# Patient Record
Sex: Female | Born: 1995 | Race: White | Hispanic: No | Marital: Single | Smoking: Former smoker
Health system: Southern US, Community
[De-identification: ages and names within clinical notes are randomized; demographics above are authoritative.]

## PROBLEM LIST (undated history)

## (undated) DIAGNOSIS — N907 Vulvar cyst: Secondary | ICD-10-CM

## (undated) DIAGNOSIS — T7840XA Allergy, unspecified, initial encounter: Secondary | ICD-10-CM

## (undated) DIAGNOSIS — J45909 Unspecified asthma, uncomplicated: Secondary | ICD-10-CM

## (undated) DIAGNOSIS — F419 Anxiety disorder, unspecified: Secondary | ICD-10-CM

## (undated) DIAGNOSIS — F410 Panic disorder [episodic paroxysmal anxiety] without agoraphobia: Secondary | ICD-10-CM

## (undated) DIAGNOSIS — L0591 Pilonidal cyst without abscess: Secondary | ICD-10-CM

## (undated) HISTORY — DX: Vulvar cyst: N90.7

## (undated) HISTORY — DX: Anxiety disorder, unspecified: F41.9

## (undated) HISTORY — DX: Unspecified asthma, uncomplicated: J45.909

## (undated) HISTORY — DX: Pilonidal cyst without abscess: L05.91

## (undated) HISTORY — DX: Panic disorder (episodic paroxysmal anxiety): F41.0

## (undated) HISTORY — DX: Allergy, unspecified, initial encounter: T78.40XA

---
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
## 2015-11-14 HISTORY — PX: WISDOM TOOTH EXTRACTION: SHX21

## 2016-12-22 DIAGNOSIS — J4520 Mild intermittent asthma, uncomplicated: Secondary | ICD-10-CM | POA: Insufficient documentation

## 2018-03-14 ENCOUNTER — Ambulatory Visit: Payer: Self-pay | Admitting: Physician Assistant

## 2018-03-14 ENCOUNTER — Other Ambulatory Visit: Payer: Self-pay

## 2018-03-14 ENCOUNTER — Encounter: Payer: Self-pay | Admitting: Physician Assistant

## 2018-03-14 ENCOUNTER — Ambulatory Visit (INDEPENDENT_AMBULATORY_CARE_PROVIDER_SITE_OTHER): Payer: BLUE CROSS/BLUE SHIELD | Admitting: Physician Assistant

## 2018-03-14 VITALS — BP 100/68 | HR 68 | Ht 66.0 in | Wt 220.0 lb

## 2018-03-14 DIAGNOSIS — Z0000 Encounter for general adult medical examination without abnormal findings: Secondary | ICD-10-CM

## 2018-03-14 DIAGNOSIS — Z6835 Body mass index (BMI) 35.0-35.9, adult: Secondary | ICD-10-CM

## 2018-03-14 DIAGNOSIS — J301 Allergic rhinitis due to pollen: Secondary | ICD-10-CM

## 2018-03-14 DIAGNOSIS — E66812 Obesity, class 2: Secondary | ICD-10-CM | POA: Insufficient documentation

## 2018-03-14 DIAGNOSIS — Z124 Encounter for screening for malignant neoplasm of cervix: Secondary | ICD-10-CM

## 2018-03-14 DIAGNOSIS — Z7689 Persons encountering health services in other specified circumstances: Secondary | ICD-10-CM | POA: Diagnosis not present

## 2018-03-14 DIAGNOSIS — E782 Mixed hyperlipidemia: Secondary | ICD-10-CM | POA: Insufficient documentation

## 2018-03-14 DIAGNOSIS — E6609 Other obesity due to excess calories: Secondary | ICD-10-CM | POA: Diagnosis not present

## 2018-03-14 MED ORDER — FLUTICASONE PROPIONATE 50 MCG/ACT NA SUSP: 2.0000 | Freq: Every day | NASAL | 6 refills | Status: DC

## 2018-03-14 NOTE — Progress Notes (Deleted)
       Patient: Margaret Grant, Female    DOB: [birth year], 22 y.o.   MRN: [id number] Visit Date: 03/14/2018  Today's Provider: Mar Daring, PA-C   No chief complaint on file.  Subjective:    Annual physical exam Margaret Grant is a 22 y.o. female who presents today for health maintenance and complete physical. She feels {DESC; WELL/FAIRLY WELL/POORLY:18703}. She reports exercising ***. She reports she is sleeping {DESC; WELL/FAIRLY WELL/POORLY:18703}.  -----------------------------------------------------------------   Review of Systems  Social History      She         Social History   Socioeconomic History  . Marital status: Single    Spouse name: Not on file  . Number of children: Not on file  . Years of education: Not on file  . Highest education level: Not on file  Occupational History  . Not on file  Social Needs  . Financial resource strain: Not on file  . Food insecurity:    Worry: Not on file    Inability: Not on file  . Transportation needs:    Medical: Not on file    Non-medical: Not on file  Tobacco Use  . Smoking status: Not on file  Substance and Sexual Activity  . Alcohol use: Not on file  . Drug use: Not on file  . Sexual activity: Not on file  Lifestyle  . Physical activity:    Days per week: Not on file    Minutes per session: Not on file  . Stress: Not on file  Relationships  . Social connections:    Talks on phone: Not on file    Gets together: Not on file    Attends religious service: Not on file    Active member of club or organization: Not on file    Attends meetings of clubs or organizations: Not on file    Relationship status: Not on file  Other Topics Concern  . Not on file  Social History Narrative  . Not on file    No past medical history on file.   There are no active problems to display for this patient.   *** The histories are not reviewed yet. Please review them in the "History" navigator section and  refresh this [place].  Family History        No family status information on file.        Her family history is not on file.      Allergies not on file  No current outpatient medications on file.   Patient Care Team: Rubye Beach as PCP - General (Family Medicine)      Objective:   Vitals: There were no vitals taken for this visit.  There were no vitals filed for this visit.   Physical Exam   Depression Screen No flowsheet data found.    Assessment & Plan:     Routine Health Maintenance and Physical Exam  Exercise Activities and Dietary recommendations Goals    None       There is no immunization history on file for this patient.  There are no preventive care reminders to display for this patient.   Discussed health benefits of physical activity, and encouraged her to engage in regular exercise appropriate for her age and condition.    --------------------------------------------------------------------    Mar Daring, PA-C  [facility]

## 2018-03-14 NOTE — Progress Notes (Signed)
Patient: Margaret Grant, Female    DOB: Nov 28, 1995, 21 y.o.   MRN: [id number] Visit Date: 03/14/2018  Today's Provider: Mar Daring, PA-C   Chief Complaint  Patient presents with  . Establish Care  . Annual Exam   Subjective:    Annual physical exam Margaret Grant is a 22 y.o. female who presents today for health maintenance and complete physical. She feels fairly well. She reports exercising 4-5 times a week for 1-2 hours. She reports she is sleeping fairly well. -----------------------------------------------------------------   Review of Systems  Constitutional: Negative.   HENT: Positive for rhinorrhea, sinus pain, sneezing and sore throat.   Eyes: Negative.   Respiratory: Positive for cough.   Cardiovascular: Negative.   Gastrointestinal: Negative.   Endocrine: Negative.   Genitourinary: Negative.   Musculoskeletal: Negative.   Skin: Negative.   Allergic/Immunologic: Positive for environmental allergies.  Neurological: Negative.   Hematological: Negative.     Social History      She  reports that she has quit smoking. Her smoking use included cigarettes. She has quit using smokeless tobacco. She reports that she drinks alcohol. She reports that she does not use drugs.       Social History   Socioeconomic History  . Marital status: Single    Spouse name: Not on file  . Number of children: 0  . Years of education: Not on file  . Highest education level: Not on file  Occupational History  . Not on file  Social Needs  . Financial resource strain: Not on file  . Food insecurity:    Worry: Not on file    Inability: Not on file  . Transportation needs:    Medical: Not on file    Non-medical: Not on file  Tobacco Use  . Smoking status: Former Smoker    Types: Cigarettes  . Smokeless tobacco: Former Network engineer and Sexual Activity  . Alcohol use: Yes  . Drug use: Never  . Sexual activity: Yes    Birth control/protection: IUD    Lifestyle  . Physical activity:    Days per week: Not on file    Minutes per session: Not on file  . Stress: Not on file  Relationships  . Social connections:    Talks on phone: Not on file    Gets together: Not on file    Attends religious service: Not on file    Active member of club or organization: Not on file    Attends meetings of clubs or organizations: Not on file    Relationship status: Not on file  Other Topics Concern  . Not on file  Social History Narrative  . Not on file    Past Medical History:  Diagnosis Date  . Allergy   . Anxiety   . Asthma   . Labial cyst   . Panic attacks   . Pilonidal cyst      There are no active problems to display for this patient.   Past Surgical History:  Procedure Laterality Date  . KNEE ARTHROSCOPY W/ MENISCAL REPAIR  2012  . WISDOM TOOTH EXTRACTION  2017    Family History        Family Status  Relation Name Status  . Mother  (Not Specified)  . Father  (Not Specified)  . Mat Aunt  (Not Specified)  . Mat Uncle  (Not Specified)  . MGM  (Not Specified)  . MGF  (Not Specified)  .  Cousin  (Not Specified)        Her family history includes Anxiety disorder in her cousin and mother; COPD in her maternal grandmother; Cancer in her maternal aunt, maternal grandfather, and maternal uncle; Depression in her cousin and mother; Drug abuse in her father; Gout in her cousin; Hypertension in her maternal grandfather and mother; Hypotension in her maternal grandmother; Stroke in her maternal grandmother.      No Known Allergies  No current outpatient medications on file.   Patient Care Team: Mar Daring, PA-C as PCP - General (Family Medicine)      Objective:   Vitals: BP 100/68 (BP Location: Left Arm, Patient Position: Sitting, Cuff Size: Large)   Pulse 68   Ht 5\' 6"  (1.676 m)   Wt 220 lb (99.8 kg)   SpO2 98%   BMI 35.51 kg/m    Vitals:   03/14/18 1021  BP: 100/68  Pulse: 68  SpO2: 98%  Weight: 220 lb  (99.8 kg)  Height: 5\' 6"  (1.676 m)     Physical Exam  Constitutional: She is oriented to person, place, and time. She appears well-developed and well-nourished. No distress.  HENT:  Head: Normocephalic and atraumatic.  Right Ear: Hearing, tympanic membrane, external ear and ear canal normal.  Left Ear: Hearing, tympanic membrane, external ear and ear canal normal.  Nose: Nose normal.  Mouth/Throat: Uvula is midline, oropharynx is clear and moist and mucous membranes are normal. No oropharyngeal exudate.  Eyes: Pupils are equal, round, and reactive to light. Conjunctivae and EOM are normal. Right eye exhibits no discharge. Left eye exhibits no discharge. No scleral icterus.  Neck: Normal range of motion. Neck supple. No JVD present. Carotid bruit is not present. No tracheal deviation present. No thyromegaly present.  Cardiovascular: Normal rate, regular rhythm, normal heart sounds and intact distal pulses. Exam reveals no gallop and no friction rub.  No murmur heard. Pulmonary/Chest: Effort normal and breath sounds normal. No respiratory distress. She has no wheezes. She has no rales. She exhibits no tenderness. Right breast exhibits no inverted nipple, no mass, no nipple discharge, no skin change and no tenderness. Left breast exhibits no inverted nipple, no mass, no nipple discharge, no skin change and no tenderness. No breast tenderness, discharge or bleeding. Breasts are symmetrical.  Abdominal: Soft. Bowel sounds are normal. She exhibits no distension and no mass. There is no tenderness. There is no rebound and no guarding. Hernia confirmed negative in the right inguinal area and confirmed negative in the left inguinal area.  Genitourinary: Rectum normal and uterus normal. No breast tenderness, discharge or bleeding. Pelvic exam was performed with patient supine. There is no rash, tenderness, lesion or injury on the right labia. There is no rash, tenderness, lesion or injury on the left labia.  Cervix exhibits no motion tenderness, no discharge and no friability. Right adnexum displays no mass, no tenderness and no fullness. Left adnexum displays no mass, no tenderness and no fullness. There is bleeding (faint bloody discharge) in the vagina. No erythema or tenderness in the vagina. No signs of injury around the vagina. No vaginal discharge found.    Musculoskeletal: Normal range of motion. She exhibits no edema or tenderness.  Lymphadenopathy:    She has no cervical adenopathy.       Right: No inguinal adenopathy present.       Left: No inguinal adenopathy present.  Neurological: She is alert and oriented to person, place, and time. She has normal reflexes. No  cranial nerve deficit. Coordination normal.  Skin: Skin is warm and dry. No rash noted. She is not diaphoretic.  Psychiatric: She has a normal mood and affect. Her behavior is normal. Judgment and thought content normal.  Vitals reviewed.    Depression Screen PHQ 2/9 Scores 03/14/2018  PHQ - 2 Score 0      Assessment & Plan:     Routine Health Maintenance and Physical Exam  Exercise Activities and Dietary recommendations Goals    None      Immunization History  Administered Date(s) Administered  . Tdap 08/03/2008    Health Maintenance  Topic Date Due  . HIV Screening  09/09/2011  . PAP SMEAR  09/08/2017  . INFLUENZA VACCINE  06/13/2018  . TETANUS/TDAP  08/03/2018     Discussed health benefits of physical activity, and encouraged her to engage in regular exercise appropriate for her age and condition.    1. Encounter to establish care Previously followed by [facility].   2. Annual physical exam Normal physical exam today. Will check labs as below and f/u pending lab results. If labs are stable and WNL she will not need to have these rechecked for one year at her next annual physical exam. She is to call the office in the meantime if she has any acute issue, questions or concerns. - CBC w/Diff/Platelet -  Comprehensive Metabolic Panel (CMET) - TSH - Lipid Profile  3. Cervical cancer screening Pap collected today. Will send as below and f/u pending results. - Pap IG, Ct-Ng TV rfx HPV all  4. Mixed hyperlipidemia LDL last year borderline high at 128 per records. Will check labs as below and f/u pending results. - Lipid Profile  5. Class 2 obesity due to excess calories without serious comorbidity with body mass index (BMI) of 35.0 to 35.9 in adult Counseled patient on healthy lifestyle modifications including dieting and exercise.  - Comprehensive Metabolic Panel (CMET) - Lipid Profile  6. Seasonal allergic rhinitis due to pollen Not taking anything for allergies. Will treat with flonase as below.  - fluticasone (FLONASE) 50 MCG/ACT nasal spray; Place 2 sprays into both nostrils daily.  Dispense: 16 g; Refill: 6  --------------------------------------------------------------------    Mar Daring, PA-C  [facility]

## 2018-03-14 NOTE — Patient Instructions (Signed)

## 2018-03-15 LAB — CBC WITH DIFFERENTIAL/PLATELET
BASOS ABS: 0 10*3/uL (ref 0.0–0.2)
BASOS: 1 %
EOS (ABSOLUTE): 0.4 10*3/uL (ref 0.0–0.4)
Eos: 6 %
HEMOGLOBIN: 14.4 g/dL (ref 11.1–15.9)
Hematocrit: 41 % (ref 34.0–46.6)
Immature Grans (Abs): 0 10*3/uL (ref 0.0–0.1)
Immature Granulocytes: 0 %
LYMPHS: 32 %
Lymphocytes Absolute: 2.1 10*3/uL (ref 0.7–3.1)
MCH: 29.9 pg (ref 26.6–33.0)
MCHC: 35.1 g/dL (ref 31.5–35.7)
MCV: 85 fL (ref 79–97)
Monocytes Absolute: 0.6 10*3/uL (ref 0.1–0.9)
Monocytes: 9 %
NEUTROS ABS: 3.5 10*3/uL (ref 1.4–7.0)
Neutrophils: 52 %
Platelets: 341 10*3/uL (ref 150–379)
RBC: 4.81 x10E6/uL (ref 3.77–5.28)
RDW: 13.2 % (ref 12.3–15.4)
WBC: 6.6 10*3/uL (ref 3.4–10.8)

## 2018-03-15 LAB — COMPREHENSIVE METABOLIC PANEL
ALT: 11 IU/L (ref 0–32)
AST: 13 IU/L (ref 0–40)
Albumin/Globulin Ratio: 1.6 (ref 1.2–2.2)
Albumin: 4.3 g/dL (ref 3.5–5.5)
Alkaline Phosphatase: 49 IU/L (ref 39–117)
BILIRUBIN TOTAL: 0.2 mg/dL (ref 0.0–1.2)
BUN / CREAT RATIO: 15 (ref 9–23)
BUN: 12 mg/dL (ref 6–20)
CO2: 20 mmol/L (ref 20–29)
CREATININE: 0.79 mg/dL (ref 0.57–1.00)
Calcium: 9.6 mg/dL (ref 8.7–10.2)
Chloride: 106 mmol/L (ref 96–106)
GFR calc non Af Amer: 107 mL/min/{1.73_m2} (ref >59)
GFR, EST AFRICAN AMERICAN: 124 mL/min/{1.73_m2} (ref >59)
GLUCOSE: 82 mg/dL (ref 65–99)
Globulin, Total: 2.7 g/dL (ref 1.5–4.5)
Potassium: 4.4 mmol/L (ref 3.5–5.2)
Sodium: 141 mmol/L (ref 134–144)
TOTAL PROTEIN: 7 g/dL (ref 6.0–8.5)

## 2018-03-15 LAB — LIPID PANEL
CHOL/HDL RATIO: 4.2 ratio (ref 0.0–4.4)
Cholesterol, Total: 195 mg/dL (ref 100–199)
HDL: 46 mg/dL (ref >39)
LDL Calculated: 127 mg/dL — ABNORMAL HIGH (ref 0–99)
Triglycerides: 111 mg/dL (ref 0–149)
VLDL CHOLESTEROL CAL: 22 mg/dL (ref 5–40)

## 2018-03-15 LAB — TSH: TSH: 1.49 u[IU]/mL (ref 0.450–4.500)

## 2018-03-18 LAB — PAP IG, CT-NG TV RFX HPV ALL
Chlamydia, Nuc. Acid Amp: NEGATIVE
GONOCOCCUS, NUC. ACID AMP: NEGATIVE
PAP SMEAR COMMENT: 0
TRICH VAG BY NAA: NEGATIVE

## 2018-08-21 ENCOUNTER — Encounter: Payer: Self-pay | Admitting: Physician Assistant

## 2018-08-21 ENCOUNTER — Ambulatory Visit (INDEPENDENT_AMBULATORY_CARE_PROVIDER_SITE_OTHER): Payer: BLUE CROSS/BLUE SHIELD | Admitting: Physician Assistant

## 2018-08-21 VITALS — BP 100/60 | HR 81 | Temp 98.6°F | Wt 225.8 lb

## 2018-08-21 DIAGNOSIS — M25561 Pain in right knee: Secondary | ICD-10-CM

## 2018-08-21 DIAGNOSIS — Z9889 Other specified postprocedural states: Secondary | ICD-10-CM | POA: Diagnosis not present

## 2018-08-21 NOTE — Patient Instructions (Signed)
Meniscus Tear A meniscus tear is a knee injury in which a piece of the meniscus is torn. The meniscus is a thick, rubbery, wedge-shaped cartilage in the knee. Two menisci are located in each knee. They sit between the upper bone (femur) and lower bone (tibia) that make up the knee joint. Each meniscus acts as a shock absorber for the knee. A torn meniscus is one of the most common types of knee injuries. This injury can range from mild to severe. Surgery may be needed for a severe tear. What are the causes? This injury may be caused by any squatting, twisting, or pivoting movement. Sports-related injuries are the most common cause. These often occur from:  Running and stopping suddenly.  Changing direction.  Being tackled or knocked off your feet.  As people get older, their meniscus gets thinner and weaker. In these people, tears can happen more easily, such as from climbing stairs. What increases the risk? This injury is more likely to happen to:  People who play contact sports.  Males.  People who are 30?22 years of age.  What are the signs or symptoms? Symptoms of this injury include:  Knee pain, especially at the side of the knee joint. You may feel pain when the injury occurs, or you may only hear a pop and feel pain later.  A feeling that your knee is clicking, catching, locking, or giving way.  Not being able to fully bend or extend your knee.  Bruising or swelling in your knee.  How is this diagnosed? This injury may be diagnosed based on your symptoms and a physical exam. The physical exam may include:  Moving your knee in different ways.  Feeling for tenderness.  Listening for a clicking sound.  Checking if your knee locks or catches.  You may also have tests, such as:  X-rays.  MRI.  A procedure to look inside your knee with a narrow surgical telescope (arthroscopy).  You may be referred to a knee specialist (orthopedic surgeon). How is this  treated? Treatment for this injury depends on the severity of the tear. Treatment for a mild tear may include:  Rest.  Medicine to reduce pain and swelling. This is usually a nonsteroidal anti-inflammatory drug (NSAID).  A knee brace or an elastic sleeve or wrap.  Using crutches or a walker to keep weight off your knee and to help you walk.  Exercises to strengthen your knee (physical therapy).  You may need surgery if you have a severe tear or if other treatments are not working. Follow these instructions at home: Managing pain and swelling  Take over-the-counter and prescription medicines only as told by your health care provider.  If directed, apply ice to the injured area: ? Put ice in a plastic bag. ? Place a towel between your skin and the bag. ? Leave the ice on for 20 minutes, 2-3 times per day.  Raise (elevate) the injured area above the level of your heart while you are sitting or lying down. Activity  Do not use the injured limb to support your body weight until your health care provider says that you can. Use crutches or a walker as told by your health care provider.  Return to your normal activities as told by your health care provider. Ask your health care provider what activities are safe for you.  Perform range-of-motion exercises only as told by your health care provider.  Begin doing exercises to strengthen your knee and leg muscles only   as told by your health care provider. After you recover, your health care provider may recommend these exercises to help prevent another injury. General instructions  Use a knee brace or elastic wrap as told by your health care provider.  Keep all follow-up visits as told by your health care provider. This is important. Contact a health care provider if:  You have a fever.  Your knee becomes red, tender, or swollen.  Your pain medicine is not helping.  Your symptoms get worse or do not improve after 2 weeks of home  care. This information is not intended to replace advice given to you by your health care provider. Make sure you discuss any questions you have with your health care provider. Document Released: 01/20/2003 Document Revised: 04/06/2016 Document Reviewed: 02/22/2015 Elsevier Interactive Patient Education  2018 [employer]vier Inc[employer]  

## 2018-08-21 NOTE — Progress Notes (Signed)
Patient: Margaret Grant Female    DOB: 07-09-1996   22 y.o.   MRN: [id number] Visit Date: 08/21/2018  Today's Provider: Mar Daring, PA-C   No chief complaint on file.  Subjective:    Knee Pain   The incident occurred 5 to 7 days ago. The incident occurred at work. The pain is present in the right knee. The pain is at a severity of 0/10. The patient is experiencing no pain. Nothing aggravates the symptoms.    Follow up ER visit  Patient was seen in [place] for knee injury on 08/16/18. She was treated for knee injury. She had went into a deep squat and felt it lock, the a pop sound and sensation, followed by pain and loss of ROM.   Treatment for this included x-ray, RICE, and IBU. She reports good compliance with treatment. She reports this condition is Improved. She is still limited in ROM and cannot fully extend her knee but reports improvements daily.   She does have h/o knee surgery from when she was 13 (2011). She had a torn meniscus and had arthroscopic repair.  ------------------------------------------------------------------------------------     No Known Allergies   Current Outpatient Medications:  .  fluticasone (FLONASE) 50 MCG/ACT nasal spray, Place 2 sprays into both nostrils daily. (Patient not taking: Reported on 08/21/2018), Disp: 16 g, Rfl: 6  Review of Systems  Constitutional: Negative.   Respiratory: Negative.   Cardiovascular: Negative.   Musculoskeletal: Positive for arthralgias and joint swelling.  Neurological: Negative.     Social History   Tobacco Use  . Smoking status: Former Smoker    Types: Cigarettes  . Smokeless tobacco: Former Network engineer Use Topics  . Alcohol use: Yes   Objective:   BP 100/60 (BP Location: Left Arm, Patient Position: Sitting, Cuff Size: Normal)   Pulse 81   Temp 98.6 F (37 C) (Oral)   Wt 225 lb 12.8 oz (102.4 kg)   SpO2 98%   BMI 36.45 kg/m  Vitals:   08/21/18 1322  BP: 100/60   Pulse: 81  Temp: 98.6 F (37 C)  TempSrc: Oral  SpO2: 98%  Weight: 225 lb 12.8 oz (102.4 kg)     Physical Exam  Constitutional: She appears well-developed and well-nourished. No distress.  Neck: Normal range of motion. Neck supple.  Cardiovascular: Normal rate, regular rhythm and normal heart sounds. Exam reveals no gallop and no friction rub.  No murmur heard. Pulmonary/Chest: Effort normal and breath sounds normal. No respiratory distress. She has no wheezes. She has no rales.  Musculoskeletal:       Right knee: She exhibits decreased range of motion (cannot fully extend the knee), swelling and abnormal meniscus. She exhibits no effusion, no erythema, normal alignment, no LCL laxity, normal patellar mobility, no bony tenderness and no MCL laxity. No tenderness found.  Negative varus/valgus, negative lachman, negative ant/post drawer, McMurray testing was positive. Patient did not want me to put too much pressure on knee and instantly guarded with muscles to keep from a deep flexion position  Skin: She is not diaphoretic.  Vitals reviewed.  RADIOLOGY  Xr Knee 4 Or More Views Right  Result Date: 08/16/2018 CLINICAL DATA: Knee injury EXAM: RIGHT KNEE - COMPLETE 4+ VIEW COMPARISON: None. FINDINGS: No evidence of fracture, dislocation, or joint effusion. No evidence of arthropathy or other focal bone abnormality. Soft tissues are unremarkable.   Negative. Electronically Signed By: Donavan Foil M.D. On: 08/16/2018  17:32     Assessment & Plan:     1. Acute pain of right knee Suspicious for recurrent meniscal injury vs possibly scar tissue. Pain has improved. Still limited with ROM. Will refer to orthopedics as below for further evaluation.  - Ambulatory referral to Orthopedic Surgery  2. H/O right knee surgery 2011 right meniscus, arthroscopic.  - Ambulatory referral to [place], PA-C  [facility]

## 2018-10-31 ENCOUNTER — Encounter: Payer: Self-pay | Admitting: Physician Assistant

## 2018-11-01 ENCOUNTER — Ambulatory Visit: Payer: Self-pay | Admitting: Physician Assistant

## 2018-11-01 ENCOUNTER — Encounter: Payer: Self-pay | Admitting: Physician Assistant

## 2018-11-01 VITALS — BP 123/78 | HR 80 | Temp 98.4°F | Resp 16 | Wt 239.0 lb

## 2018-11-01 DIAGNOSIS — L03115 Cellulitis of right lower limb: Secondary | ICD-10-CM

## 2018-11-01 MED ORDER — DOXYCYCLINE HYCLATE 100 MG PO TABS: 100.0000 mg | ORAL_TABLET | Freq: Two times a day (BID) | ORAL | 0 refills | Status: DC

## 2018-11-01 NOTE — Patient Instructions (Signed)

## 2018-11-01 NOTE — Progress Notes (Signed)
       Patient: Margaret Grant Female    DOB: [birth year]   22 y.o.   MRN: [id number] Visit Date: 11/01/2018  Today's Provider: Mar Daring, PA-C   Chief Complaint  Patient presents with  . Leg Pain   Subjective:     HPI Patient here today c/o pain on the top of her right foot. "At first I just thought I hit it and it bruised but I didn't notice any discoloration. Then it spread upward and is now in my calf. Also now there's a red mark on my calf and it feels like a lump. My foot and lower leg on the front side is pretty tender to the touch. I've never experienced this before and it's not getting any better." Patient concerned it may be a spider bite.  No Known Allergies   Current Outpatient Medications:  .  levonorgestrel (MIRENA) 20 MCG/24HR IUD, by Intrauterine route., Disp: , Rfl:   Review of Systems  Constitutional: Negative.   Respiratory: Negative.   Cardiovascular: Negative.   Gastrointestinal: Negative.   Skin: Positive for color change and rash.    Social History   Tobacco Use  . Smoking status: Former Smoker    Types: Cigarettes  . Smokeless tobacco: Former Network engineer Use Topics  . Alcohol use: Yes      Objective:   BP 123/78 (BP Location: Left Arm, Patient Position: Sitting, Cuff Size: Normal)   Pulse 80   Temp 98.4 F (36.9 C) (Oral)   Resp 16   Wt 239 lb (108.4 kg)   BMI 38.58 kg/m  Vitals:   11/01/18 1707  BP: 123/78  Pulse: 80  Resp: 16  Temp: 98.4 F (36.9 C)  TempSrc: Oral  Weight: 239 lb (108.4 kg)     Physical Exam Vitals signs reviewed.  Constitutional:      Appearance: She is well-developed.  HENT:     Head: Normocephalic and atraumatic.  Neck:     Musculoskeletal: Normal range of motion and neck supple.  Pulmonary:     Effort: Pulmonary effort is normal. No respiratory distress.  Musculoskeletal:        General: Swelling present.  Skin:    Findings: Erythema present.       Psychiatric:      Behavior: Behavior normal.        Thought Content: Thought content normal.        Judgment: Judgment normal.         Assessment & Plan    1. Cellulitis of right lower extremity Unsure of cause. Will start doxycycline as below. Elevate leg. Call if symptoms worsen. - doxycycline (VIBRA-TABS) 100 MG tablet; Take 1 tablet (100 mg total) by mouth 2 (two) times daily.  Dispense: 20 tablet; Refill: 0     Mar Daring, PA-C  [facility]

## 2019-04-29 ENCOUNTER — Encounter: Payer: Self-pay | Admitting: Physician Assistant

## 2019-04-29 DIAGNOSIS — L300 Nummular dermatitis: Secondary | ICD-10-CM

## 2019-04-29 MED ORDER — TRIAMCINOLONE ACETONIDE 0.1 % EX CREA: 1.0000 "application " | TOPICAL_CREAM | Freq: Two times a day (BID) | CUTANEOUS | 0 refills | Status: DC

## 2019-10-22 NOTE — Progress Notes (Signed)
Patient: Margaret Grant, Female    DOB: 1996-07-24, 23 y.o.   MRN: [id number] Visit Date: 10/23/2019  Today's Provider: Mar Daring, PA-C   Chief Complaint  Patient presents with  . Annual Exam   Subjective:     Annual physical exam Margaret Grant is a 23 y.o. female who presents today for health maintenance and complete physical. She feels well. She reports exercising includes cardio and weight lifting. She reports she is sleeping well. ----------------------------------------------------------------- Last pap:03/14/2018-Normal.  Review of Systems  Constitutional: Negative.   HENT: Negative.   Eyes: Positive for photophobia and itching.  Respiratory: Negative.   Cardiovascular: Negative.   Gastrointestinal: Negative.   Endocrine: Negative.   Genitourinary: Negative.   Musculoskeletal: Negative.   Skin: Positive for rash.  Allergic/Immunologic: Positive for environmental allergies.  Neurological: Positive for headaches.  Hematological: Bruises/bleeds easily.  Psychiatric/Behavioral: The patient is nervous/anxious.     Social History      She  reports that she has quit smoking. Her smoking use included cigarettes. She has quit using smokeless tobacco. She reports current alcohol use. She reports that she does not use drugs.       Social History   Socioeconomic History  . Marital status: Single    Spouse name: Not on file  . Number of children: 0  . Years of education: Not on file  . Highest education level: Not on file  Occupational History  . Not on file  Tobacco Use  . Smoking status: Former Smoker    Types: Cigarettes  . Smokeless tobacco: Former Network engineer and Sexual Activity  . Alcohol use: Yes  . Drug use: Never  . Sexual activity: Yes    Birth control/protection: I.U.D.  Other Topics Concern  . Not on file  Social History Narrative  . Not on file   Social Determinants of Health   Financial Resource Strain:   .  Difficulty of Paying Living Expenses: Not on file  Food Insecurity:   . Worried About Charity fundraiser in the Last Year: Not on file  . Ran Out of Food in the Last Year: Not on file  Transportation Needs:   . Lack of Transportation (Medical): Not on file  . Lack of Transportation (Non-Medical): Not on file  Physical Activity:   . Days of Exercise per Week: Not on file  . Minutes of Exercise per Session: Not on file  Stress:   . Feeling of Stress : Not on file  Social Connections:   . Frequency of Communication with Friends and Family: Not on file  . Frequency of Social Gatherings with Friends and Family: Not on file  . Attends Religious Services: Not on file  . Active Member of Clubs or Organizations: Not on file  . Attends Archivist Meetings: Not on file  . Marital Status: Not on file    Past Medical History:  Diagnosis Date  . Allergy   . Anxiety   . Asthma   . Labial cyst   . Panic attacks   . Pilonidal cyst      Patient Active Problem List   Diagnosis Date Noted  . Mixed hyperlipidemia 03/14/2018  . Class 2 obesity due to excess calories without serious comorbidity with body mass index (BMI) of 35.0 to 35.9 in adult 03/14/2018    Past Surgical History:  Procedure Laterality Date  . KNEE ARTHROSCOPY W/ MENISCAL REPAIR  2012  . WISDOM TOOTH  EXTRACTION  2017    Family History        Family Status  Relation Name Status  . Mother  (Not Specified)  . Father  (Not Specified)  . Mat Aunt  (Not Specified)  . Mat Uncle  (Not Specified)  . MGM  (Not Specified)  . MGF  (Not Specified)  . Cousin  (Not Specified)        Her family history includes Anxiety disorder in her cousin and mother; COPD in her maternal grandmother; Cancer in her maternal aunt, maternal grandfather, and maternal uncle; Depression in her cousin and mother; Drug abuse in her father; Gout in her cousin; Hypertension in her maternal grandfather and mother; Hypotension in her maternal  grandmother; Stroke in her maternal grandmother.      No Known Allergies   Current Outpatient Medications:  .  levonorgestrel (MIRENA) 20 MCG/24HR IUD, by Intrauterine route., Disp: , Rfl:  .  triamcinolone cream (KENALOG) 0.1 %, Apply 1 application topically 2 (two) times daily., Disp: 30 g, Rfl: 0 .  doxycycline (VIBRA-TABS) 100 MG tablet, Take 1 tablet (100 mg total) by mouth 2 (two) times daily. (Patient not taking: Reported on 10/23/2019), Disp: 20 tablet, Rfl: 0   Patient Care Team: Mar Daring, PA-C as PCP - General (Family Medicine) Mar Daring, PA-C (Family Medicine)    Objective:    Vitals: BP 130/85 (BP Location: Left Arm, Patient Position: Sitting, Cuff Size: Normal)   Pulse 90   Temp (!) 97.1 F (36.2 C) (Temporal)   Ht 5' 6" (1.676 m)   Wt 247 lb 6.4 oz (112.2 kg)   BMI 39.93 kg/m    Vitals:   10/23/19 1351  BP: 130/85  Pulse: 90  Temp: (!) 97.1 F (36.2 C)  TempSrc: Temporal  Weight: 247 lb 6.4 oz (112.2 kg)  Height: 5' 6" (1.676 m)     Physical Exam Vitals reviewed.  Constitutional:      General: She is not in acute distress.    Appearance: Normal appearance. She is well-developed. She is obese. She is not ill-appearing or diaphoretic.  HENT:     Head: Normocephalic and atraumatic.     Right Ear: Tympanic membrane, ear canal and external ear normal.     Left Ear: Tympanic membrane, ear canal and external ear normal.     Nose: Nose normal.  Eyes:     General: No scleral icterus.       Right eye: No discharge.        Left eye: No discharge.     Extraocular Movements: Extraocular movements intact.     Conjunctiva/sclera: Conjunctivae normal.     Pupils: Pupils are equal, round, and reactive to light.  Neck:     Thyroid: No thyromegaly.     Vascular: No JVD.     Trachea: No tracheal deviation.  Cardiovascular:     Rate and Rhythm: Normal rate and regular rhythm.     Pulses: Normal pulses.     Heart sounds: Normal heart  sounds. No murmur. No friction rub. No gallop.   Pulmonary:     Effort: Pulmonary effort is normal. No respiratory distress.     Breath sounds: Normal breath sounds. No wheezing or rales.  Chest:     Chest wall: No tenderness.  Abdominal:     General: Abdomen is protuberant. Bowel sounds are normal. There is no distension.     Palpations: Abdomen is soft. There is no mass.  Tenderness: There is no abdominal tenderness. There is no guarding or rebound.  Musculoskeletal:        General: No tenderness. Normal range of motion.     Cervical back: Normal range of motion and neck supple.     Right lower leg: No edema.     Left lower leg: No edema.  Lymphadenopathy:     Cervical: No cervical adenopathy.  Skin:    General: Skin is warm and dry.     Capillary Refill: Capillary refill takes less than 2 seconds.     Findings: No rash.  Neurological:     General: No focal deficit present.     Mental Status: She is alert and oriented to person, place, and time. Mental status is at baseline.  Psychiatric:        Mood and Affect: Mood normal.        Behavior: Behavior normal.        Thought Content: Thought content normal.        Judgment: Judgment normal.      Depression Screen PHQ 2/9 Scores 03/14/2018  PHQ - 2 Score 0       Assessment & Plan:     Routine Health Maintenance and Physical Exam  Exercise Activities and Dietary recommendations Goals   None     Immunization History  Administered Date(s) Administered  . DTaP 11/24/1996, 05/04/1997, 07/17/1997, 04/06/1998, 12/12/2000  . Hepatitis B 08/12/1996, 10/17/1996, 05/04/1997  . HiB (PRP-OMP) 11/24/1996, 05/04/1997, 07/17/1997, 04/06/1998  . Hpv 06/17/2009, 06/26/2014, 12/09/2014  . IPV 11/24/1996, 05/04/1997, 07/17/1997, 12/12/2000  . MMR 04/06/1998, 12/12/2000  . Meningococcal Conjugate 06/26/2014  . Td 08/03/2008  . Tdap 08/03/2008  . Varicella 06/26/2014, 09/17/2014    Health Maintenance  Topic Date Due  .  HIV Screening  09/09/2011  . PAP-Cervical Cytology Screening  09/08/2017  . INFLUENZA VACCINE  06/14/2019  . PAP SMEAR-Modifier  03/14/2021  . TETANUS/TDAP  11/14/2023     Discussed health benefits of physical activity, and encouraged her to engage in regular exercise appropriate for her age and condition.    1. Annual physical exam Normal physical exam today. Grant check labs as below and f/u pending lab results. If labs are stable and WNL she Grant not need to have these rechecked for one year at her next annual physical exam. She is to call the office in the meantime if she has any acute issue, questions or concerns. - CBC w/Diff/Platelet - Comprehensive Metabolic Panel (CMET) - Lipid Profile  2. Class 2 severe obesity due to excess calories with serious comorbidity and body mass index (BMI) of 39.0 to 39.9 in adult [facility]) Counseled patient on healthy lifestyle modifications including dieting and exercise.  - CBC w/Diff/Platelet - Comprehensive Metabolic Panel (CMET) - Lipid Profile  3. Mixed hyperlipidemia LDL was borderline high at 126 last year. Diet controlled. Grant check labs as below and f/u pending results. - CBC w/Diff/Platelet - Comprehensive Metabolic Panel (CMET) - Lipid Profile  4. Screening for HIV without presence of risk factors Grant check labs as below and f/u pending results. - HIV antibody (with reflex)  5. Mild exercise-induced asthma Has some wheezing noted in cold temperatures when she is exercising and training. Grant send refill albuterol inhaler as her old inhaler has expired. Advised to keep near when exercising, specifically in winter months.  - albuterol (VENTOLIN HFA) 108 (90 Base) MCG/ACT inhaler; Inhale 2 puffs into the lungs every 6 (six) hours as needed for wheezing or shortness  of breath.  Dispense: 18 g; Refill: 1  6. Environmental and seasonal allergies Stable. Diagnosis pulled for medication refill. Continue current medical treatment plan. -  fluticasone (FLONASE) 50 MCG/ACT nasal spray; Place 2 sprays into both nostrils daily.  Dispense: 16 g; Refill: 6  7. Rash and nonspecific skin eruption Referral to dermatology for further evaluation. Has tried OTC lotrisone and tinactin. Has tried Rx triamcinolone without resolution.  - Ambulatory referral to Dermatology --------------------------------------------------------------------    Mar Daring, PA-C  [facility]

## 2019-10-23 ENCOUNTER — Encounter: Payer: Self-pay | Admitting: Physician Assistant

## 2019-10-23 ENCOUNTER — Other Ambulatory Visit: Payer: Self-pay

## 2019-10-23 ENCOUNTER — Ambulatory Visit (INDEPENDENT_AMBULATORY_CARE_PROVIDER_SITE_OTHER): Payer: BC Managed Care – PPO | Admitting: Physician Assistant

## 2019-10-23 VITALS — BP 130/85 | HR 90 | Temp 97.1°F | Ht 66.0 in | Wt 247.4 lb

## 2019-10-23 DIAGNOSIS — Z0000 Encounter for general adult medical examination without abnormal findings: Secondary | ICD-10-CM | POA: Diagnosis not present

## 2019-10-23 DIAGNOSIS — Z114 Encounter for screening for human immunodeficiency virus [HIV]: Secondary | ICD-10-CM

## 2019-10-23 DIAGNOSIS — J3089 Other allergic rhinitis: Secondary | ICD-10-CM

## 2019-10-23 DIAGNOSIS — Z6839 Body mass index (BMI) 39.0-39.9, adult: Secondary | ICD-10-CM

## 2019-10-23 DIAGNOSIS — R21 Rash and other nonspecific skin eruption: Secondary | ICD-10-CM

## 2019-10-23 DIAGNOSIS — J45990 Exercise induced bronchospasm: Secondary | ICD-10-CM

## 2019-10-23 DIAGNOSIS — E782 Mixed hyperlipidemia: Secondary | ICD-10-CM

## 2019-10-23 MED ORDER — FLUTICASONE PROPIONATE 50 MCG/ACT NA SUSP
2.0000 | Freq: Every day | NASAL | 6 refills | Status: DC
Start: 2019-10-23 — End: 2021-11-16

## 2019-10-23 MED ORDER — ALBUTEROL SULFATE HFA 108 (90 BASE) MCG/ACT IN AERS: 2.0000 | INHALATION_SPRAY | Freq: Four times a day (QID) | RESPIRATORY_TRACT | 1 refills | Status: DC | PRN

## 2019-10-23 NOTE — Patient Instructions (Signed)
Health Maintenance, Female Adopting a healthy lifestyle and getting preventive care are important in promoting health and wellness. Ask your health care provider about:  The right schedule for you to have regular tests and exams.  Things you can do on your own to prevent diseases and keep yourself healthy. What should I know about diet, weight, and exercise? Eat a healthy diet   Eat a diet that includes plenty of vegetables, fruits, low-fat dairy products, and lean protein.  Do not eat a lot of foods that are high in solid fats, added sugars, or sodium. Maintain a healthy weight Body mass index (BMI) is used to identify weight problems. It estimates body fat based on height and weight. Your health care provider can help determine your BMI and help you achieve or maintain a healthy weight. Get regular exercise Get regular exercise. This is one of the most important things you can do for your health. Most adults should:  Exercise for at least 150 minutes each week. The exercise should increase your heart rate and make you sweat (moderate-intensity exercise).  Do strengthening exercises at least twice a week. This is in addition to the moderate-intensity exercise.  Spend less time sitting. Even light physical activity can be beneficial. Watch cholesterol and blood lipids Have your blood tested for lipids and cholesterol at 23 years of age, then have this test every 5 years. Have your cholesterol levels checked more often if:  Your lipid or cholesterol levels are high.  You are older than 23 years of age.  You are at high risk for heart disease. What should I know about cancer screening? Depending on your health history and family history, you may need to have cancer screening at various ages. This may include screening for:  Breast cancer.  Cervical cancer.  Colorectal cancer.  Skin cancer.  Lung cancer. What should I know about heart disease, diabetes, and high blood  pressure? Blood pressure and heart disease  High blood pressure causes heart disease and increases the risk of stroke. This is more likely to develop in people who have high blood pressure readings, are of African descent, or are overweight.  Have your blood pressure checked: ? Every 3-5 years if you are 18-39 years of age. ? Every year if you are 40 years old or older. Diabetes Have regular diabetes screenings. This checks your fasting blood sugar level. Have the screening done:  Once every three years after age 40 if you are at a normal weight and have a low risk for diabetes.  More often and at a younger age if you are overweight or have a high risk for diabetes. What should I know about preventing infection? Hepatitis B If you have a higher risk for hepatitis B, you should be screened for this virus. Talk with your health care provider to find out if you are at risk for hepatitis B infection. Hepatitis C Testing is recommended for:  Everyone born from 1945 through 1965.  Anyone with known risk factors for hepatitis C. Sexually transmitted infections (STIs)  Get screened for STIs, including gonorrhea and chlamydia, if: ? You are sexually active and are younger than 24 years of age. ? You are older than 24 years of age and your health care provider tells you that you are at risk for this type of infection. ? Your sexual activity has changed since you were last screened, and you are at increased risk for chlamydia or gonorrhea. Ask your health care provider if   you are at risk.  Ask your health care provider about whether you are at high risk for HIV. Your health care provider may recommend a prescription medicine to help prevent HIV infection. If you choose to take medicine to prevent HIV, you should first get tested for HIV. You should then be tested every 3 months for as long as you are taking the medicine. Pregnancy  If you are about to stop having your period (premenopausal) and  you may become pregnant, seek counseling before you get pregnant.  Take 400 to 800 micrograms (mcg) of folic acid every day if you become pregnant.  Ask for birth control (contraception) if you want to prevent pregnancy. Osteoporosis and menopause Osteoporosis is a disease in which the bones lose minerals and strength with aging. This can result in bone fractures. If you are 65 years old or older, or if you are at risk for osteoporosis and fractures, ask your health care provider if you should:  Be screened for bone loss.  Take a calcium or vitamin D supplement to lower your risk of fractures.  Be given hormone replacement therapy (HRT) to treat symptoms of menopause. Follow these instructions at home: Lifestyle  Do not use any products that contain nicotine or tobacco, such as cigarettes, e-cigarettes, and chewing tobacco. If you need help quitting, ask your health care provider.  Do not use street drugs.  Do not share needles.  Ask your health care provider for help if you need support or information about quitting drugs. Alcohol use  Do not drink alcohol if: ? Your health care provider tells you not to drink. ? You are pregnant, may be pregnant, or are planning to become pregnant.  If you drink alcohol: ? Limit how much you use to 0-1 drink a day. ? Limit intake if you are breastfeeding.  Be aware of how much alcohol is in your drink. In the [place] one drink equals one 12 oz bottle of beer (355 mL), one 5 oz glass of wine (148 mL), or one 1 oz glass of hard liquor (44 mL). General instructions  Schedule regular health, dental, and eye exams.  Stay current with your vaccines.  Tell your health care provider if: ? You often feel depressed. ? You have ever been abused or do not feel safe at home. Summary  Adopting a healthy lifestyle and getting preventive care are important in promoting health and wellness.  Follow your health care provider's instructions about healthy  diet, exercising, and getting tested or screened for diseases.  Follow your health care provider's instructions on monitoring your cholesterol and blood pressure. This information is not intended to replace advice given to you by your health care provider. Make sure you discuss any questions you have with your health care provider. Document Released: 05/15/2011 Document Revised: 10/23/2018 Document Reviewed: 10/23/2018 Elsevier Patient Education  2020 [employer]  

## 2019-10-24 ENCOUNTER — Telehealth: Payer: Self-pay

## 2019-10-24 LAB — CBC WITH DIFFERENTIAL/PLATELET
Basophils Absolute: 0.1 10*3/uL (ref 0.0–0.2)
Basos: 1 %
EOS (ABSOLUTE): 0.6 10*3/uL — ABNORMAL HIGH (ref 0.0–0.4)
Eos: 8 %
Hematocrit: 41.2 % (ref 34.0–46.6)
Hemoglobin: 14.2 g/dL (ref 11.1–15.9)
Immature Grans (Abs): 0 10*3/uL (ref 0.0–0.1)
Immature Granulocytes: 0 %
Lymphocytes Absolute: 2.7 10*3/uL (ref 0.7–3.1)
Lymphs: 34 %
MCH: 29.7 pg (ref 26.6–33.0)
MCHC: 34.5 g/dL (ref 31.5–35.7)
MCV: 86 fL (ref 79–97)
Monocytes Absolute: 0.7 10*3/uL (ref 0.1–0.9)
Monocytes: 9 %
Neutrophils Absolute: 4 10*3/uL (ref 1.4–7.0)
Neutrophils: 48 %
Platelets: 388 10*3/uL (ref 150–450)
RBC: 4.78 x10E6/uL (ref 3.77–5.28)
RDW: 12.7 % (ref 11.7–15.4)
WBC: 8.1 10*3/uL (ref 3.4–10.8)

## 2019-10-24 LAB — LIPID PANEL
Chol/HDL Ratio: 5.3 ratio — ABNORMAL HIGH (ref 0.0–4.4)
Cholesterol, Total: 221 mg/dL — ABNORMAL HIGH (ref 100–199)
HDL: 42 mg/dL (ref >39)
LDL Chol Calc (NIH): 138 mg/dL — ABNORMAL HIGH (ref 0–99)
Triglycerides: 229 mg/dL — ABNORMAL HIGH (ref 0–149)
VLDL Cholesterol Cal: 41 mg/dL — ABNORMAL HIGH (ref 5–40)

## 2019-10-24 LAB — COMPREHENSIVE METABOLIC PANEL
ALT: 18 IU/L (ref 0–32)
AST: 17 IU/L (ref 0–40)
Albumin/Globulin Ratio: 1.7 (ref 1.2–2.2)
Albumin: 4.2 g/dL (ref 3.9–5.0)
Alkaline Phosphatase: 47 IU/L (ref 39–117)
BUN/Creatinine Ratio: 14 (ref 9–23)
BUN: 12 mg/dL (ref 6–20)
Bilirubin Total: 0.2 mg/dL (ref 0.0–1.2)
CO2: 21 mmol/L (ref 20–29)
Calcium: 9.4 mg/dL (ref 8.7–10.2)
Chloride: 103 mmol/L (ref 96–106)
Creatinine, Ser: 0.83 mg/dL (ref 0.57–1.00)
GFR calc Af Amer: 115 mL/min/{1.73_m2} (ref >59)
GFR calc non Af Amer: 100 mL/min/{1.73_m2} (ref >59)
Globulin, Total: 2.5 g/dL (ref 1.5–4.5)
Glucose: 86 mg/dL (ref 65–99)
Potassium: 4.1 mmol/L (ref 3.5–5.2)
Sodium: 140 mmol/L (ref 134–144)
Total Protein: 6.7 g/dL (ref 6.0–8.5)

## 2019-10-24 LAB — HIV ANTIBODY (ROUTINE TESTING W REFLEX): HIV Screen 4th Generation wRfx: NONREACTIVE

## 2019-10-24 NOTE — Telephone Encounter (Signed)
-----   Message from Mar Daring, [place] sent at 10/24/2019 10:17 AM EST ----- Blood count is normal. Kidney and liver function is normal. Sodium, potassium and calcium are normal. Cholesterol has increased compared to last year. This may be just because this was a non-fasting lab. Just continue to work on healthy lifestyle modifications and we will check next year. HIV screen done once in a lifetime, unless exposed, is negative.

## 2019-10-24 NOTE — Telephone Encounter (Signed)
Patient advised as directed below. 

## 2020-01-14 ENCOUNTER — Encounter: Payer: Self-pay | Admitting: Physician Assistant

## 2020-01-14 ENCOUNTER — Other Ambulatory Visit: Payer: Self-pay

## 2020-01-14 ENCOUNTER — Other Ambulatory Visit (HOSPITAL_COMMUNITY)
Admission: RE | Admit: 2020-01-14 | Discharge: 2020-01-14 | Disposition: A | Discharge status: Home / Self Care | Payer: BC Managed Care – PPO | Source: Ambulatory Visit | Attending: Physician Assistant | Admitting: Physician Assistant

## 2020-01-14 ENCOUNTER — Ambulatory Visit: Payer: Self-pay

## 2020-01-14 ENCOUNTER — Ambulatory Visit: Payer: BC Managed Care – PPO | Admitting: Physician Assistant

## 2020-01-14 VITALS — BP 128/88 | HR 96 | Temp 97.3°F | Wt 258.0 lb

## 2020-01-14 DIAGNOSIS — R102 Pelvic and perineal pain: Secondary | ICD-10-CM | POA: Diagnosis not present

## 2020-01-14 DIAGNOSIS — N912 Amenorrhea, unspecified: Secondary | ICD-10-CM

## 2020-01-14 DIAGNOSIS — R1032 Left lower quadrant pain: Secondary | ICD-10-CM | POA: Diagnosis not present

## 2020-01-14 DIAGNOSIS — Z975 Presence of (intrauterine) contraceptive device: Secondary | ICD-10-CM | POA: Diagnosis not present

## 2020-01-14 DIAGNOSIS — R109 Unspecified abdominal pain: Secondary | ICD-10-CM | POA: Insufficient documentation

## 2020-01-14 DIAGNOSIS — Z124 Encounter for screening for malignant neoplasm of cervix: Secondary | ICD-10-CM | POA: Diagnosis not present

## 2020-01-14 DIAGNOSIS — R300 Dysuria: Secondary | ICD-10-CM | POA: Diagnosis not present

## 2020-01-14 LAB — POCT URINALYSIS DIPSTICK
Bilirubin, UA: NEGATIVE
Blood, UA: NEGATIVE
Glucose, UA: NEGATIVE
Ketones, UA: NEGATIVE
Leukocytes, UA: NEGATIVE
Nitrite, UA: NEGATIVE
Protein, UA: NEGATIVE
Spec Grav, UA: >=1.03 — AB (ref 1.010–1.025)
Urobilinogen, UA: 0.2 E.U./dL
pH, UA: 6 (ref 5.0–8.0)

## 2020-01-14 LAB — POCT URINE PREGNANCY: Preg Test, Ur: NEGATIVE

## 2020-01-14 NOTE — Telephone Encounter (Signed)
Pt. Reports she started having lower abdominal pain Monday. Hurts "over my bladder and to the right." No urinary symptoms. Last bowel movement was yesterday. No fever.Sharp pain that is worse when she is walking or sneezes. Requests late afternoon appointment because of work. No availability with her PCP. Scheduled with Ms. Pollak.  Reason for Disposition . [1] MODERATE pain (e.g., interferes with normal activities) AND [2] pain comes and goes (cramps) AND [3] present > 24 hours  (Exception: pain with Vomiting or Diarrhea - see that Guideline)  Answer Assessment - Initial Assessment Questions 1. LOCATION: "Where does it hurt?"      Lower abdomen - right side Monday 2. RADIATION: "Does the pain shoot anywhere else?" (e.g., chest, back)     Back - rectum 3. ONSET: "When did the pain begin?" (e.g., minutes, hours or days ago)      Monday 4. SUDDEN: "Gradual or sudden onset?"     Gradual 5. PATTERN "Does the pain come and go, or is it constant?"    - If constant: "Is it getting better, staying the same, or worsening?"      (Note: Constant means the pain never goes away completely; most serious pain is constant and it progresses)     - If intermittent: "How long does it last?" "Do you have pain now?"     (Note: Intermittent means the pain goes away completely between bouts)     Sitting - dull ache. With movement, it hurts 6. SEVERITY: "How bad is the pain?"  (e.g., Scale 1-10; mild, moderate, or severe)   - MILD (1-3): doesn't interfere with normal activities, abdomen soft and not tender to touch    - MODERATE (4-7): interferes with normal activities or awakens from sleep, tender to touch    - SEVERE (8-10): excruciating pain, doubled over, unable to do any normal activities      Rest - 3  Moving - 8 7. RECURRENT SYMPTOM: "Have you ever had this type of abdominal pain before?" If so, ask: "When was the last time?" and "What happened that time?"      No 8. CAUSE: "What do you think is causing  the abdominal pain?"     Unsure 9. RELIEVING/AGGRAVATING FACTORS: "What makes it better or worse?" (e.g., movement, antacids, bowel movement)     Sitting still helps 10. OTHER SYMPTOMS: "Has there been any vomiting, diarrhea, constipation, or urine problems?"       Last BM - Yesterday 11. PREGNANCY: "Is there any chance you are pregnant?" "When was your last menstrual period?"       No  Protocols used: ABDOMINAL PAIN - [facility]

## 2020-01-14 NOTE — Progress Notes (Signed)
Patient: Margaret Grant Female    DOB: [birth year]   24 y.o.   MRN: [id number] Visit Date: 01/14/2020  Today's Provider: Trinna Post, PA-C   Chief Complaint  Patient presents with  . Abdominal Pain   Subjective:     Abdominal Pain This is a new problem. The current episode started in the past 7 days. The onset quality is gradual. The problem occurs intermittently. The problem has been unchanged. The pain is located in the LUQ and RUQ. The pain is at a severity of 3/10 (8/10, moderate when moving around per pt.). The pain is mild. The quality of the pain is sharp. The abdominal pain radiates to the back (rectum per pt). The pain is aggravated by movement (sneezing per pt).   Patient reports bilateral abdominal pain that intermittent pain that has worsened and traveled to the center of her abdomen and more in the pelvic region.. She is not having fever, chills, nausea, vomiting. Denies dysuria. Denies vaginal discharge above baseline. Denies vaginal bleeding. Does have an appenedix and gallbaldder. No history of gallstones. No fevers, chills. Issues eating. She has a mirena IUD and does not have regular periods. Reports prior to having Mirena IUD that her periods were irregular. She was never told she has PCOS or endometriosis. She is not having trouble breathing or chest pain.   No Known Allergies   Current Outpatient Medications:  .  albuterol (VENTOLIN HFA) 108 (90 Base) MCG/ACT inhaler, Inhale 2 puffs into the lungs every 6 (six) hours as needed for wheezing or shortness of breath., Disp: 18 g, Rfl: 1 .  fluticasone (FLONASE) 50 MCG/ACT nasal spray, Place 2 sprays into both nostrils daily., Disp: 16 g, Rfl: 6 .  levonorgestrel (MIRENA) 20 MCG/24HR IUD, by Intrauterine route., Disp: , Rfl:  .  triamcinolone cream (KENALOG) 0.1 %, Apply 1 application topically 2 (two) times daily., Disp: 30 g, Rfl: 0  Review of Systems  Gastrointestinal: Positive for abdominal pain.     Social History   Tobacco Use  . Smoking status: Former Smoker    Types: Cigarettes  . Smokeless tobacco: Former Network engineer Use Topics  . Alcohol use: Yes      Objective:   BP 128/88 (BP Location: Right Arm, Patient Position: Sitting, Cuff Size: Large)   Pulse 96   Temp (!) 97.3 F (36.3 C) (Temporal)   Wt 258 lb (117 kg)   SpO2 97%   BMI 41.64 kg/m  Vitals:   01/14/20 1559  BP: 128/88  Pulse: 96  Temp: (!) 97.3 F (36.3 C)  TempSrc: Temporal  SpO2: 97%  Weight: 258 lb (117 kg)  Body mass index is 41.64 kg/m.   Physical Exam Constitutional:      Appearance: She is well-developed.  Cardiovascular:     Rate and Rhythm: Normal rate and regular rhythm.     Heart sounds: Normal heart sounds.  Pulmonary:     Effort: Pulmonary effort is normal.     Breath sounds: Normal breath sounds.  Abdominal:     General: Abdomen is flat. Bowel sounds are normal. There is no distension.     Palpations: Abdomen is soft.     Tenderness: There is no abdominal tenderness. There is no right CVA tenderness, left CVA tenderness, guarding or rebound.  Genitourinary:    Vagina: No signs of injury. No vaginal discharge, erythema, tenderness or bleeding.     Cervix: No cervical motion tenderness or discharge.  Uterus: Normal.      Adnexa: Right adnexa normal and left adnexa normal.       Right: No mass or tenderness.         Left: No mass or tenderness.    Skin:    General: Skin is warm and dry.  Neurological:     Mental Status: She is alert and oriented to person, place, and time.  Psychiatric:        Mood and Affect: Mood normal.        Behavior: Behavior normal.      No results found for any visits on 01/14/20.     Assessment & Plan    1. Pelvic pain  Urine pregnancy normal. Urine dipstick normal. Will send for culture. Abdominal exam benign. Pelvic exam benign with normal appearing cervix, no cervical motion tenderness or adnexal tenderness. DDx: appendicitis,  cholecystitis, colitis, pelvic floor dysfunction, ectopic pregnancy, ovarian cyst, cystitis, vaginitis. Most suspicious of ovarian cyst and/or pelvic floor dysfunction. Patient would like to wait for labs and then move on from there. If labs are unrevealing, may proceed with pelvic and transvaginal ultrasound to assess for cysts.   - Cytology - PAP - POCT urinalysis dipstick  2. Abdominal pain, unspecified abdominal location  - Cytology - PAP - POCT urinalysis dipstick  3. Cervical cancer screening  - Cytology - PAP  4. Dysuria  - CULTURE, URINE COMPREHENSIVE  5. Amenorrhea  - POCT urine pregnancy  The entirety of the information documented in the History of Present Illness, Review of Systems and Physical Exam were personally obtained by me. Portions of this information were initially documented by [employer] and reviewed by me for thoroughness and accuracy.   F/u PRN  I have spent 25 minutes with this patient, >50% of which was spent on counseling and coordination of care.     Trinna Post, PA-C  [facility]

## 2020-01-14 NOTE — Patient Instructions (Signed)
Pelvic Pain, Female Pelvic pain is pain in your lower belly (abdomen), below your belly button and between your hips. The pain may start suddenly (be acute), keep coming back (be recurring), or last a long time (become chronic). Pelvic pain that lasts longer than 6 months is called chronic pelvic pain. There are many causes of pelvic pain. Sometimes the cause of pelvic pain is not known. Follow these instructions at home:   Take over-the-counter and prescription medicines only as told by your doctor.  Rest as told by your doctor.  Do not have sex if it hurts.  Keep a journal of your pelvic pain. Write down: ? When the pain started. ? Where the pain is located. ? What seems to make the pain better or worse, such as food or your period (menstrual cycle). ? Any symptoms you have along with the pain.  Keep all follow-up visits as told by your doctor. This is important. Contact a doctor if:  Medicine does not help your pain.  Your pain comes back.  You have new symptoms.  You have unusual discharge or bleeding from your vagina.  You have a fever or chills.  You are having trouble pooping (constipation).  You have blood in your pee (urine) or poop (stool).  Your pee smells bad.  You feel weak or light-headed. Get help right away if:  You have sudden pain that is very bad.  Your pain keeps getting worse.  You have very bad pain and also have any of these symptoms: ? A fever. ? Feeling sick to your stomach (nausea). ? Throwing up (vomiting). ? Being very sweaty.  You pass out (lose consciousness). Summary  Pelvic pain is pain in your lower belly (abdomen), below your belly button and between your hips.  There are many possible causes of pelvic pain.  Keep a journal of your pelvic pain. This information is not intended to replace advice given to you by your health care provider. Make sure you discuss any questions you have with your health care provider. Document  Revised: 04/17/2018 Document Reviewed: 04/17/2018 Elsevier Patient Education  2020 [employer] Inc[employer]  

## 2020-01-16 ENCOUNTER — Encounter: Payer: Self-pay | Admitting: Physician Assistant

## 2020-01-16 DIAGNOSIS — R102 Pelvic and perineal pain: Secondary | ICD-10-CM

## 2020-01-16 LAB — CYTOLOGY - PAP
Chlamydia: NEGATIVE
Comment: NEGATIVE
Comment: NEGATIVE
Comment: NORMAL
Diagnosis: NEGATIVE
Neisseria Gonorrhea: NEGATIVE
Trichomonas: NEGATIVE

## 2020-01-18 LAB — CULTURE, URINE COMPREHENSIVE

## 2020-01-26 ENCOUNTER — Other Ambulatory Visit: Payer: BC Managed Care – PPO

## 2020-01-28 ENCOUNTER — Ambulatory Visit
Admission: RE | Admit: 2020-01-28 | Discharge: 2020-01-28 | Disposition: A | Discharge status: Home / Self Care | Payer: BC Managed Care – PPO | Source: Ambulatory Visit | Attending: Physician Assistant | Admitting: Physician Assistant

## 2020-01-28 ENCOUNTER — Other Ambulatory Visit: Payer: Self-pay

## 2020-01-28 DIAGNOSIS — R102 Pelvic and perineal pain: Secondary | ICD-10-CM | POA: Insufficient documentation

## 2020-02-02 ENCOUNTER — Ambulatory Visit: Payer: Self-pay | Admitting: Family Medicine

## 2020-02-05 ENCOUNTER — Ambulatory Visit: Payer: BC Managed Care – PPO | Admitting: Family Medicine

## 2020-02-05 ENCOUNTER — Other Ambulatory Visit: Payer: Self-pay

## 2020-02-05 ENCOUNTER — Encounter: Payer: Self-pay | Admitting: Family Medicine

## 2020-02-05 VITALS — BP 114/76 | HR 73 | Temp 96.8°F | Wt 250.0 lb

## 2020-02-05 DIAGNOSIS — Z30432 Encounter for removal of intrauterine contraceptive device: Secondary | ICD-10-CM | POA: Diagnosis not present

## 2020-02-05 NOTE — Patient Instructions (Signed)
IUD PLACEMENT/Removal POST-PROCEDURE INSTRUCTIONS  1. You may take Ibuprofen, Aleve or Tylenol for pain if needed.  Cramping should resolve within in 24 hours.  2. You may have a small amount of spotting.  You should wear a mini pad for the next few days.  3. You may have intercourse after 24 hours.  If you using this for birth control, it is effective immediately.  4. You need to call if you have any pelvic pain, fever, heavy bleeding or foul smelling vaginal discharge.  Irregular bleeding is common the first several months after having an IUD placed/removed. You do not need to call for this reason unless you are concerned.  5. Shower or bathe as normal

## 2020-02-05 NOTE — Progress Notes (Signed)
     Patient: Margaret Grant Female    DOB: 1995-12-20   23 y.o.   MRN: [id number] Visit Date: 02/05/2020  Today's Provider: Lavon Paganini, MD   Chief Complaint  Patient presents with  . Contraception    Pt is here to have her IUD removed.   Subjective:     HPI Patient is here for IUD removal.  She states it was placed by GYN 3 to 4 years ago.  She is interested in having it removed because she would like to have her periods resume and then soon she would like to try to conceive with her fianc.  No Known Allergies   Current Outpatient Medications:  .  albuterol (VENTOLIN HFA) 108 (90 Base) MCG/ACT inhaler, Inhale 2 puffs into the lungs every 6 (six) hours as needed for wheezing or shortness of breath., Disp: 18 g, Rfl: 1 .  fluticasone (FLONASE) 50 MCG/ACT nasal spray, Place 2 sprays into both nostrils daily., Disp: 16 g, Rfl: 6 .  levonorgestrel (MIRENA) 20 MCG/24HR IUD, by Intrauterine route., Disp: , Rfl:  .  triamcinolone cream (KENALOG) 0.1 %, Apply 1 application topically 2 (two) times daily. (Patient not taking: Reported on 02/05/2020), Disp: 30 g, Rfl: 0  Review of Systems  Social History   Tobacco Use  . Smoking status: Former Smoker    Types: Cigarettes  . Smokeless tobacco: Former Network engineer Use Topics  . Alcohol use: Yes      Objective:   BP 114/76 (BP Location: Left Arm, Patient Position: Sitting, Cuff Size: Large)   Pulse 73   Temp (!) 96.8 F (36 C) (Temporal)   Wt 250 lb (113.4 kg)   SpO2 97%   BMI 40.35 kg/m  Vitals:   02/05/20 0835  BP: 114/76  Pulse: 73  Temp: (!) 96.8 F (36 C)  TempSrc: Temporal  SpO2: 97%  Weight: 250 lb (113.4 kg)  Body mass index is 40.35 kg/m.   Physical Exam Constitutional:      Appearance: Normal appearance.  HENT:     Head: Normocephalic and atraumatic.  Genitourinary:    Comments: GYN:  External genitalia within normal limits.  Vaginal mucosa pink, moist, normal rugae.  Nonfriable cervix  without lesions, no discharge or bleeding noted on speculum exam.  IUD strings visualized from external os Neurological:     Mental Status: She is alert.  Psychiatric:        Mood and Affect: Mood normal.        Behavior: Behavior normal.      No results found for any visits on 02/05/20.     Assessment & Plan    1. Encounter for IUD removal IUD removal: Procedure note Patient was given informed consent, signed copy in the chart. Appropriate time out was taken. IUD strings easily visualized from external os.  Strings grasped with ring forceps and with patient giving increased abd pressure, strings pulled and IUD easily removed intact.  Patient tolerated procedure well.  Discussed return precautions.    Return if symptoms worsen or fail to improve.   The entirety of the information documented in the History of Present Illness, Review of Systems and Physical Exam were personally obtained by me. Portions of this information were initially documented by Ashley Royalty, CMA and reviewed by me for thoroughness and accuracy.    Tempestt Silba, Dionne Bucy, MD MPH [facility]

## 2020-06-06 IMAGING — US US PELVIS COMPLETE WITH TRANSVAGINAL
1 series · 14 of 25 positions shown · non-contrast
Comparison: None

CLINICAL DATA: Pelvic pain for 2 weeks

EXAM:
TRANSABDOMINAL AND TRANSVAGINAL ULTRASOUND OF PELVIS
TECHNIQUE: Both transabdominal and transvaginal ultrasound examinations of the
pelvis were performed. Transabdominal technique was performed for
global imaging of the pelvis including uterus, ovaries, adnexal
regions, and pelvic cul-de-sac. It was necessary to proceed with
endovaginal exam following the transabdominal exam to visualize the
uterus endometrium ovaries.

[Series 1: us pelvic complete with transvaginal · 14 of 118 slices shown]
[im 1/118]
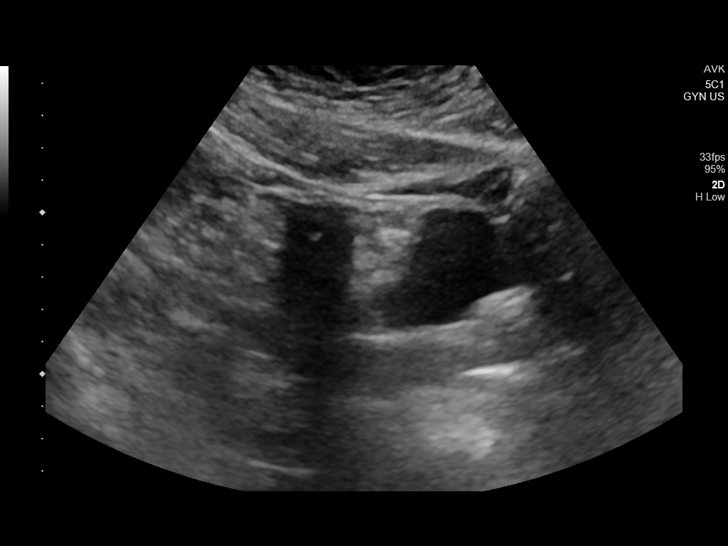
[im 10/118]
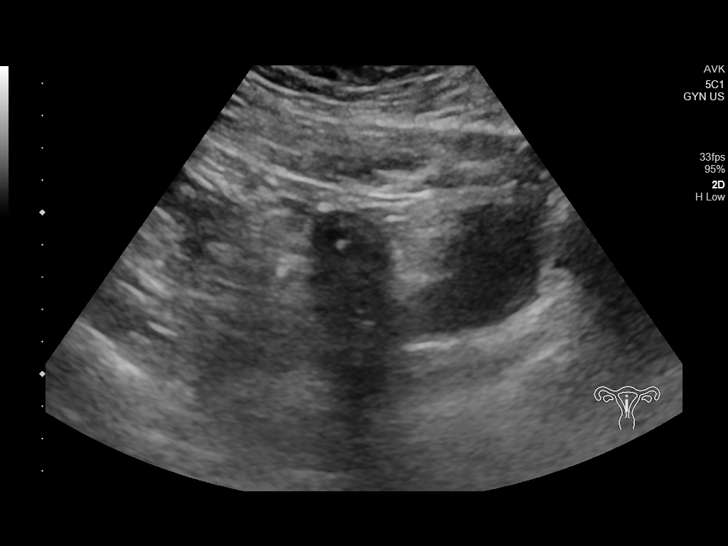
[im 20/118]
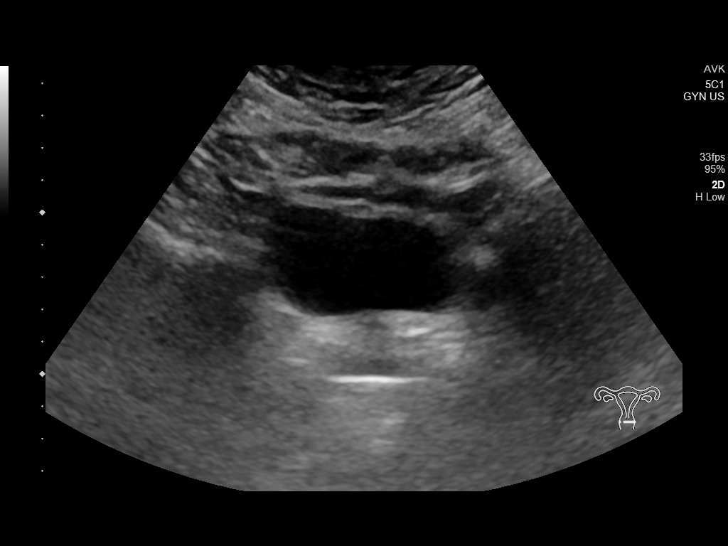
[im 30/118]
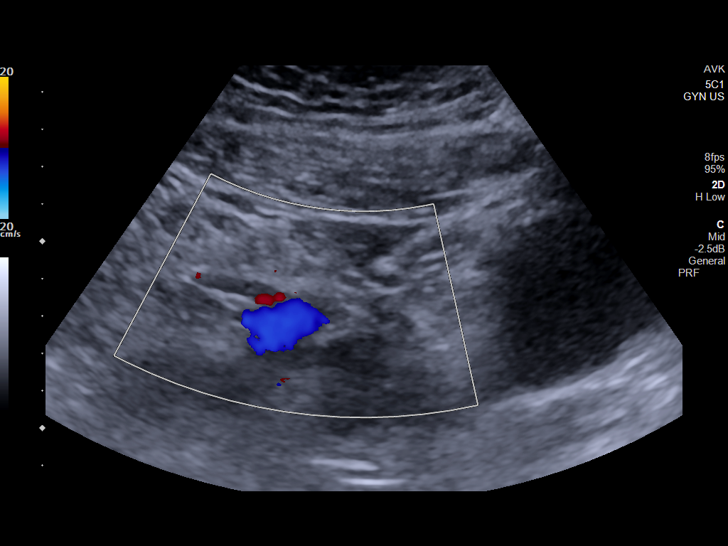
[im 40/118]
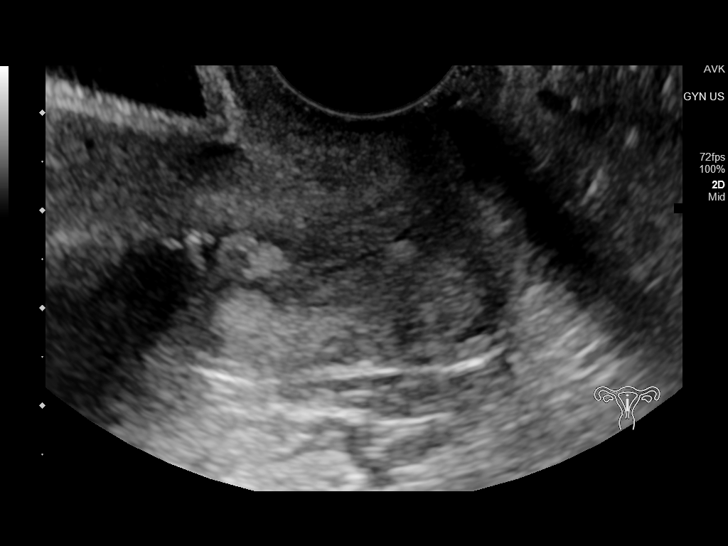
[im 44/118]
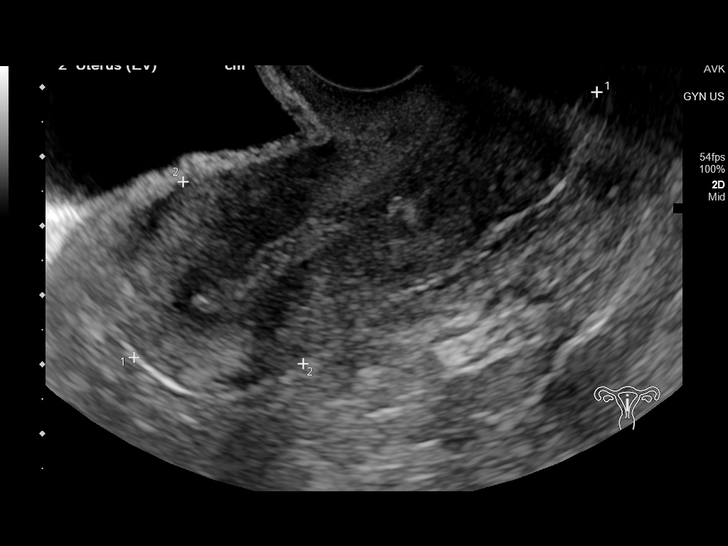
[im 54/118]
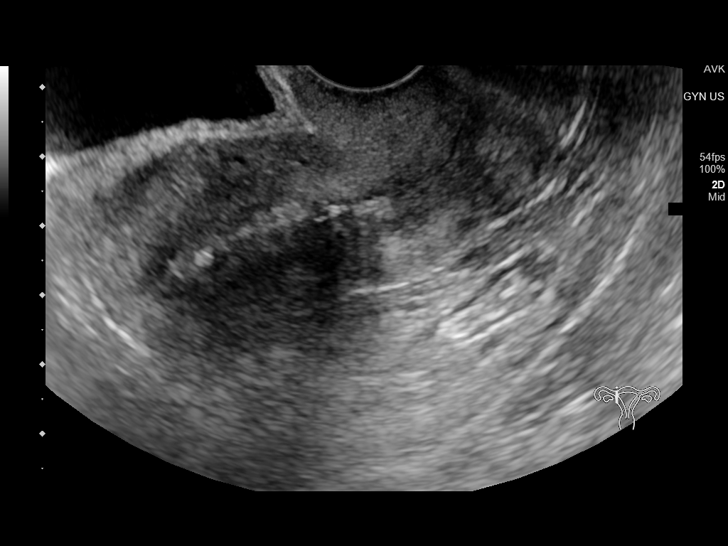
[im 64/118]
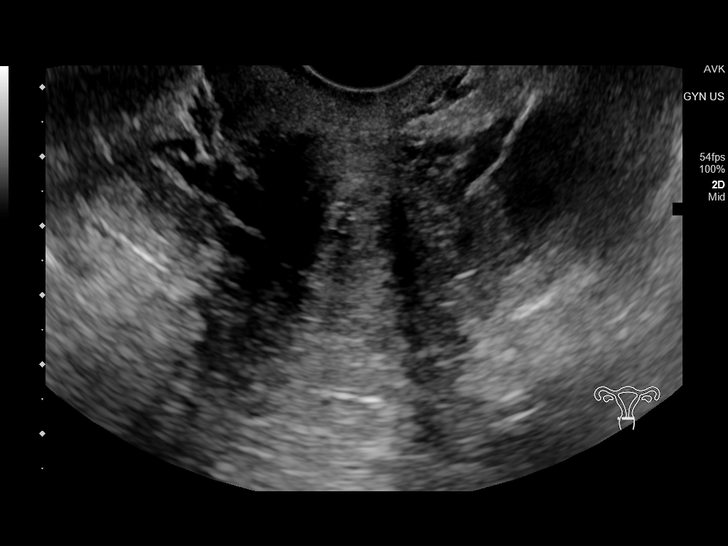
[im 74/118]
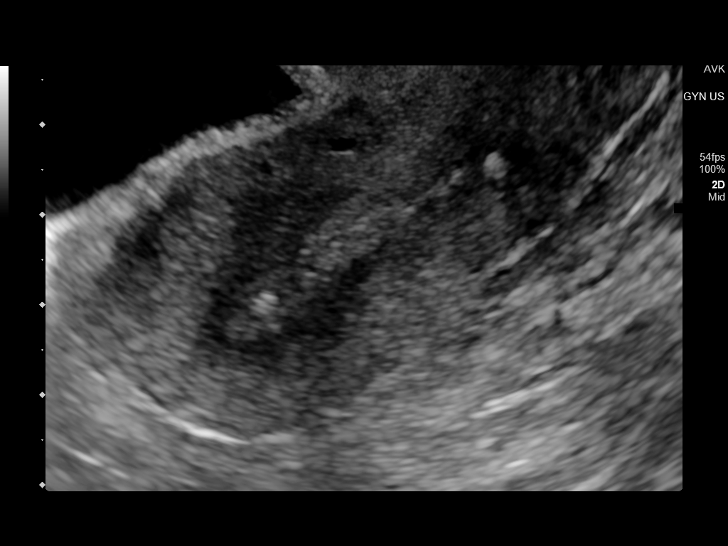
[im 79/118]
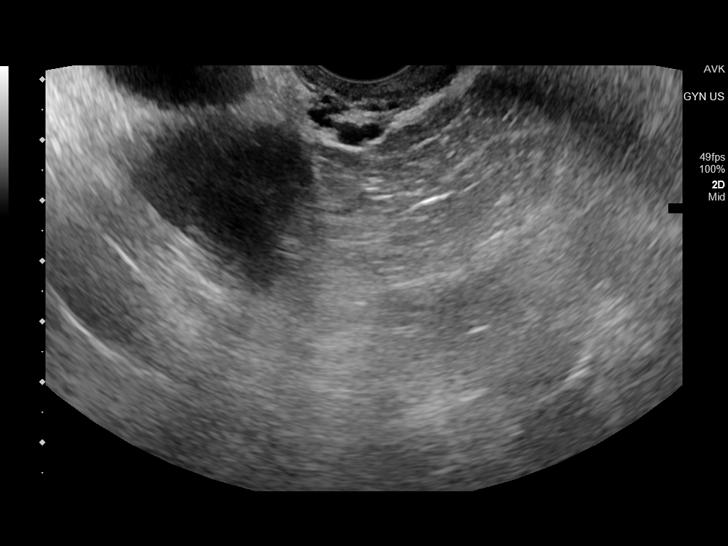
[im 88/118]
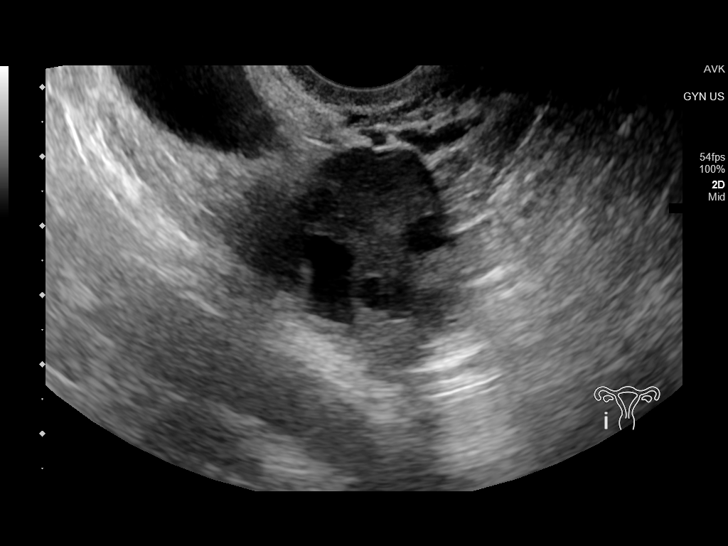
[im 98/118]
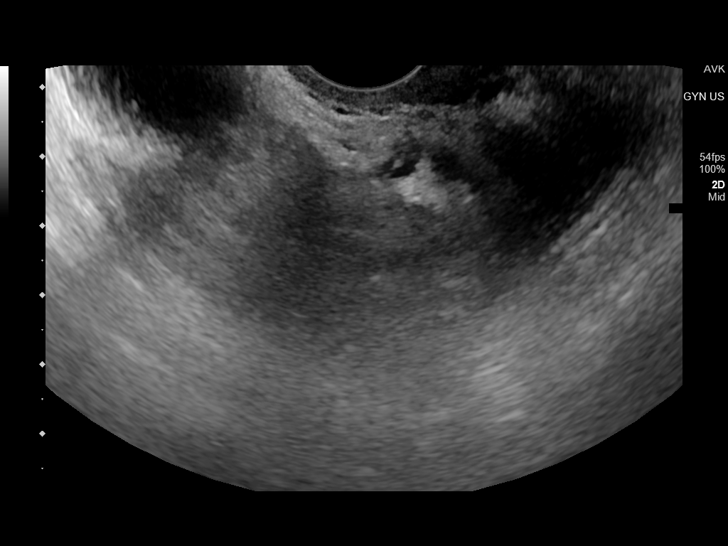
[im 108/118]
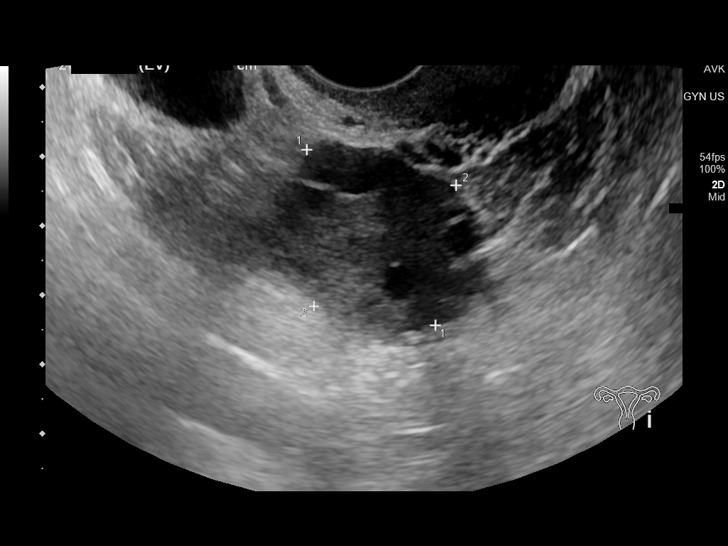
[im 118/118]
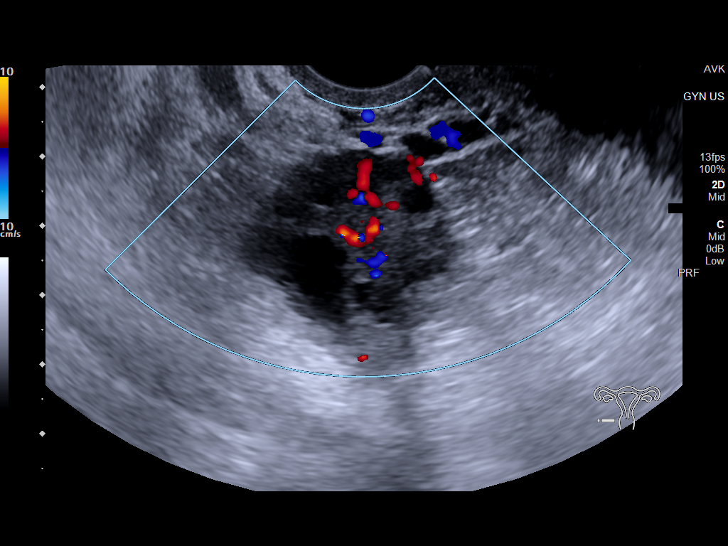

[14 of 25 positions shown; findings below may reference images not displayed]

FINDINGS: Uterus

Measurements: 7.7 x 3.2 x 3.9 cm = volume: 49.7 mL. No fibroids or
other mass visualized.

Endometrium

Thickness: 6 mm.  IUD within the uterus

Right ovary

Measurements: 3.2 x 2.7 x 2.6 cm = volume: 11.7 mL. Normal
appearance/no adnexal mass.

Left ovary

Measurements: 3.1 x 2.7 x 3 cm = volume: 13.2 mL. Normal
appearance/no adnexal mass.

Other findings

No abnormal free fluid.
IMPRESSION: IUD appears appropriately position by sonography. Negative pelvic
ultrasound

## 2020-07-12 ENCOUNTER — Encounter: Payer: Self-pay | Admitting: Physician Assistant

## 2020-07-12 ENCOUNTER — Ambulatory Visit: Payer: BC Managed Care – PPO | Admitting: Physician Assistant

## 2020-07-12 ENCOUNTER — Other Ambulatory Visit: Payer: Self-pay

## 2020-07-12 VITALS — BP 132/86 | HR 77 | Resp 16 | Wt 249.0 lb

## 2020-07-12 DIAGNOSIS — Z3201 Encounter for pregnancy test, result positive: Secondary | ICD-10-CM

## 2020-07-12 NOTE — Progress Notes (Signed)
Established patient visit   Patient: Margaret Grant   DOB: [birth year]   24 y.o. Female  MRN: [id number] Visit Date: 07/12/2020  Today's healthcare provider: Mar Daring, PA-C   Chief Complaint  Patient presents with  . Possible Pregnancy   Subjective    HPI  Patient here for possible pregnancy. She has done three urine pregnancy test at home that were positive. LMP: last one was at the end of July right after taking her IUD out. But last actual period was the end of June. She would like to have blood work done.  OB [facility] [place]  Patient Active Problem List   Diagnosis Date Noted  . Mixed hyperlipidemia 03/14/2018  . Class 2 obesity due to excess calories without serious comorbidity with body mass index (BMI) of 35.0 to 35.9 in adult 03/14/2018   Past Medical History:  Diagnosis Date  . Allergy   . Anxiety   . Asthma   . Labial cyst   . Panic attacks   . Pilonidal cyst        Medications: Outpatient Medications Prior to Visit  Medication Sig  . albuterol (VENTOLIN HFA) 108 (90 Base) MCG/ACT inhaler Inhale 2 puffs into the lungs every 6 (six) hours as needed for wheezing or shortness of breath.  . fluticasone (FLONASE) 50 MCG/ACT nasal spray Place 2 sprays into both nostrils daily.  . [DISCONTINUED] levonorgestrel (MIRENA) 20 MCG/24HR IUD by Intrauterine route.  . [DISCONTINUED] triamcinolone cream (KENALOG) 0.1 % Apply 1 application topically 2 (two) times daily. (Patient not taking: Reported on 02/05/2020)   No facility-administered medications prior to visit.    Review of Systems  Constitutional: Negative.   Respiratory: Negative.   Cardiovascular: Negative.   Gastrointestinal: Negative.   Genitourinary: Positive for menstrual problem.    Last CBC Lab Results  Component Value Date   WBC 8.1 10/23/2019   HGB 14.2 10/23/2019   HCT 41.2 10/23/2019   MCV 86 10/23/2019   MCH 29.7 10/23/2019   RDW 12.7 10/23/2019   PLT 388  05/39/7673   Last metabolic panel Lab Results  Component Value Date   GLUCOSE 86 10/23/2019   NA 140 10/23/2019   K 4.1 10/23/2019   CL 103 10/23/2019   CO2 21 10/23/2019   BUN 12 10/23/2019   CREATININE 0.83 10/23/2019   GFRNONAA 100 10/23/2019   GFRAA 115 10/23/2019   CALCIUM 9.4 10/23/2019   PROT 6.7 10/23/2019   ALBUMIN 4.2 10/23/2019   LABGLOB 2.5 10/23/2019   AGRATIO 1.7 10/23/2019   BILITOT 0.2 10/23/2019   ALKPHOS 47 10/23/2019   AST 17 10/23/2019   ALT 18 10/23/2019      Objective    BP 132/86 (BP Location: Right Arm, Patient Position: Sitting, Cuff Size: Normal)   Pulse 77   Resp 16   Wt 249 lb (112.9 kg)   BMI 40.19 kg/m  BP Readings from Last 3 Encounters:  07/12/20 132/86  02/05/20 114/76  01/14/20 128/88   Wt Readings from Last 3 Encounters:  07/12/20 249 lb (112.9 kg)  02/05/20 250 lb (113.4 kg)  01/14/20 258 lb (117 kg)      Physical Exam Vitals reviewed.  Constitutional:      General: She is not in acute distress.    Appearance: Normal appearance. She is well-developed. She is obese. She is not ill-appearing or diaphoretic.  Cardiovascular:     Rate and Rhythm: Normal rate and regular rhythm.  Heart sounds: Normal heart sounds. No murmur heard.  No friction rub. No gallop.   Pulmonary:     Effort: Pulmonary effort is normal. No respiratory distress.     Breath sounds: Normal breath sounds. No wheezing or rales.  Musculoskeletal:     Cervical back: Normal range of motion and neck supple.  Neurological:     Mental Status: She is alert.       Results for orders placed or performed in visit on 07/12/20  Beta HCG, Quant  Result Value Ref Range   hCG Quant 5,725 mIU/mL    Assessment & Plan     1. Positive pregnancy test Home testing has been positive. Will check beta HCG as below. Will refer to [facility] for further care.  - Beta HCG, Quant   Return if symptoms worsen or fail to improve.      Reynolds Bowl, PA-C, have  reviewed all documentation for this visit. The documentation on 07/21/20 for the exam, diagnosis, procedures, and orders are all accurate and complete.   Rubye Beach  [facility] [phone] (phone) [phone] (fax)  [place]

## 2020-07-12 NOTE — Patient Instructions (Signed)
Commonly Asked Questions During Pregnancy  Cats: A parasite can be excreted in cat feces.  To avoid exposure you need to have another person empty the little box.  If you must empty the litter box you will need to wear gloves.  Wash your hands after handling your cat.  This parasite can also be found in raw or undercooked meat so this should also be avoided.  Colds, Sore Throats, Flu: Please check your medication sheet to see what you can take for symptoms.  If your symptoms are unrelieved by these medications please call the office.  Dental Work: Most any dental work Investment banker, corporate recommends is permitted.  X-rays should only be taken during the first trimester if absolutely necessary.  Your abdomen should be shielded with a lead apron during all x-rays.  Please notify your provider prior to receiving any x-rays.  Novocaine is fine; gas is not recommended.  If your dentist requires a note from [place] prior to dental work please call the office and we will provide one for you.  Exercise: Exercise is an important part of staying healthy during your pregnancy.  You may continue most exercises you were accustomed to prior to pregnancy.  Later in your pregnancy you will most likely notice you have difficulty with activities requiring balance like riding a bicycle.  It is important that you listen to your body and avoid activities that put you at a higher risk of falling.  Adequate rest and staying well hydrated are a must!  If you have questions about the safety of specific activities ask your provider.    Exposure to Children with illness: Try to avoid obvious exposure; report any symptoms to [place] when noted,  If you have chicken pos, red measles or mumps, you should be immune to these diseases.   Please do not take any vaccines while pregnant unless you have checked with your OB provider.  Fetal Movement: After 28 weeks we recommend you do "kick counts" twice daily.  Lie or sit down in a calm quiet environment and  count your baby movements "kicks".  You should feel your baby at least 10 times per hour.  If you have not felt 10 kicks within the first hour get up, walk around and have something sweet to eat or drink then repeat for an additional hour.  If count remains less than 10 per hour notify your provider.  Fumigating: Follow your pest control agent's advice as to how long to stay out of your home.  Ventilate the area well before re-entering.  Hemorrhoids:   Most over-the-counter preparations can be used during pregnancy.  Check your medication to see what is safe to use.  It is important to use a stool softener or fiber in your diet and to drink lots of liquids.  If hemorrhoids seem to be getting worse please call the office.   Hot Tubs:  Hot tubs Jacuzzis and saunas are not recommended while pregnant.  These increase your internal body temperature and should be avoided.  Intercourse:  Sexual intercourse is safe during pregnancy as long as you are comfortable, unless otherwise advised by your provider.  Spotting may occur after intercourse; report any bright red bleeding that is heavier than spotting.  Labor:  If you know that you are in labor, please go to the hospital.  If you are unsure, please call the office and let us help you decide what to do.  Lifting, straining, etc:  If your job requires heavy  lifting or straining please check with your provider for any limitations.  Generally, you should not lift items heavier than that you can lift simply with your hands and arms (no back muscles)  Painting:  Paint fumes do not harm your pregnancy, but may make you ill and should be avoided if possible.  Latex or water based paints have less odor than oils.  Use adequate ventilation while painting.  Permanents & Hair Color:  Chemicals in hair dyes are not recommended as they cause increase hair dryness which can increase hair loss during pregnancy.  " Highlighting" and permanents are allowed.  Dye may be  absorbed differently and permanents may not hold as well during pregnancy.  Sunbathing:  Use a sunscreen, as skin burns easily during pregnancy.  Drink plenty of fluids; avoid over heating.  Tanning Beds:  Because their possible side effects are still unknown, tanning beds are not recommended.  Ultrasound Scans:  Routine ultrasounds are performed at approximately 20 weeks.  You will be able to see your baby's general anatomy an if you would like to know the gender this can usually be determined as well.  If it is questionable when you conceived you may also receive an ultrasound early in your pregnancy for dating purposes.  Otherwise ultrasound exams are not routinely performed unless there is a medical necessity.  Although you can request a scan we ask that you pay for it when conducted because insurance does not cover " patient request" scans.  Work: If your pregnancy proceeds without complications you may work until your due date, unless your physician or employer advises otherwise.  Round Ligament Pain/Pelvic Discomfort:  Sharp, shooting pains not associated with bleeding are fairly common, usually occurring in the second trimester of pregnancy.  They tend to be worse when standing up or when you remain standing for long periods of time.  These are the result of pressure of certain pelvic ligaments called "round ligaments".  Rest, Tylenol and heat seem to be the most effective relief.  As the womb and fetus grow, they rise out of the pelvis and the discomfort improves.  Please notify the office if your pain seems different than that described.  It may represent a more serious condition.  Common Medications Safe in Pregnancy  Acne:      Constipation:  Benzoyl Peroxide     Colace  Clindamycin      Dulcolax Suppository  Topica Erythromycin     Fibercon  Salicylic Acid      Metamucil         Miralax AVOID:        Senakot   Accutane    Cough:  Retin-A       Cough  Drops  Tetracycline      Phenergan w/ Codeine if Rx  Minocycline      Robitussin (Plain & DM)  Antibiotics:     Crabs/Lice:  Ceclor       RID  Cephalosporins    AVOID:  E-Mycins      Kwell  Keflex  Macrobid/Macrodantin   Diarrhea:  Penicillin      Kao-Pectate  Zithromax      Imodium AD         PUSH FLUIDS AVOID:       Cipro     Fever:  Tetracycline      Tylenol (Regular or Extra  Minocycline       Strength)  Levaquin      Extra Strength-Do not  Exceed 8 tabs/24 hrs Caffeine:        <200mg/day (equiv. To 1 cup of coffee or  approx. 3 12 oz sodas)         Gas: Cold/Hayfever:       Gas-X  Benadryl      Mylicon  Claritin       Phazyme  **Claritin-D        Chlor-Trimeton    Headaches:  Dimetapp      ASA-Free Excedrin  Drixoral-Non-Drowsy     Cold Compress  Mucinex (Guaifenasin)     Tylenol (Regular or Extra  Sudafed/Sudafed-12 Hour     Strength)  **Sudafed PE Pseudoephedrine   Tylenol Cold & Sinus     Vicks Vapor Rub  Zyrtec  **AVOID if Problems With Blood Pressure         Heartburn: Avoid lying down for at least 1 hour after meals  Aciphex      Maalox     Rash:  Milk of Magnesia     Benadryl    Mylanta       1% Hydrocortisone Cream  Pepcid  Pepcid Complete   Sleep Aids:  Prevacid      Ambien   Prilosec       Benadryl  Rolaids       Chamomile Tea  Tums (Limit 4/day)     Unisom         Tylenol PM         Warm milk-add vanilla or  Hemorrhoids:       Sugar for taste  Anusol/Anusol H.C.  (RX: Analapram 2.5%)  Sugar Substitutes:  Hydrocortisone OTC     Ok in moderation  Preparation H      Tucks        Vaseline lotion applied to tissue with wiping    Herpes:     Throat:  Acyclovir      Oragel  Famvir  Valtrex     Vaccines:         Flu Shot Leg Cramps:       *Gardasil  Benadryl      Hepatitis A         Hepatitis B Nasal Spray:       Pneumovax  Saline Nasal Spray     Polio Booster         Tetanus Nausea:       Tuberculosis test or PPD  Vitamin  B6 25 mg TID   AVOID:    Dramamine      *Gardasil  Emetrol       Live Poliovirus  Ginger Root 250 mg QID    MMR (measles, mumps &  High Complex Carbs @ Bedtime    rebella)  Sea Bands-Accupressure    Varicella (Chickenpox)  Unisom 1/2 tab TID     *No known complications           If received before Pain:         Known pregnancy;   Darvocet       Resume series after  Lortab        Delivery  Percocet    Yeast:   Tramadol      Femstat  Tylenol 3      Gyne-lotrimin  Ultram       Monistat  Vicodin           MISC:         All Sunscreens             Hair Coloring/highlights          Insect Repellant's          (Including DEET)         Mystic Tans    Eating Plan for Pregnant Women While you are pregnant, your body requires additional nutrition to help support your growing baby. You also have a higher need for some vitamins and minerals, such as folic acid, calcium, iron, and vitamin D. Eating a healthy, well-balanced diet is very important for your health and your baby's health. Your need for extra calories varies for the three 46-month segments of your pregnancy (trimesters). For most women, it is recommended to consume:  150 extra calories a day during the first trimester.  300 extra calories a day during the second trimester.  300 extra calories a day during the third trimester. What are tips for following this plan?   Do not try to lose weight or go on a diet during pregnancy.  Limit your overall intake of foods that have "empty calories." These are foods that have little nutritional value, such as sweets, desserts, candies, and sugar-sweetened beverages.  Eat a variety of foods (especially fruits and vegetables) to get a full range of vitamins and minerals.  Take a prenatal vitamin to help meet your additional vitamin and mineral needs during pregnancy, specifically for folic acid, iron, calcium, and vitamin D.  Remember to stay active. Ask your health care provider what types of  exercise and activities are safe for you.  Practice good food safety and cleanliness. Wash your hands before you eat and after you prepare raw meat. Wash all fruits and vegetables well before peeling or eating. Taking these actions can help to prevent food-borne illnesses that can be very dangerous to your baby, such as listeriosis. Ask your health care provider for more information about listeriosis. What does 150 extra calories look like? Healthy options that provide 150 extra calories each day could be any of the following:  6-8 oz (170-230 g) of plain low-fat yogurt with  cup of berries.  1 apple with 2 teaspoons (11 g) of peanut butter.  Cut-up vegetables with  cup (60 g) of hummus.  8 oz (230 mL) or 1 cup of low-fat chocolate milk.  1 stick of string cheese with 1 medium orange.  1 peanut butter and jelly sandwich that is made with one slice of whole-wheat bread and 1 tsp (5 g) of peanut butter. For 300 extra calories, you could eat two of those healthy options each day. What is a healthy amount of weight to gain? The right amount of weight gain for you is based on your BMI before you became pregnant. If your BMI:  Was less than 18 (underweight), you should gain 28-40 lb (13-18 kg).  Was 18-24.9 (normal), you should gain 25-35 lb (11-16 kg).  Was 25-29.9 (overweight), you should gain 15-25 lb (7-11 kg).  Was 30 or greater (obese), you should gain 11-20 lb (5-9 kg). What if I am having twins or multiples? Generally, if you are carrying twins or multiples:  You may need to eat 300-600 extra calories a day.  The recommended range for total weight gain is 25-54 lb (11-25 kg), depending on your BMI before pregnancy.  Talk with your health care provider to find out about nutritional needs, weight gain, and exercise that is right for you. What foods can I eat?  Fruits All fruits. Eat a variety of colors and types of fruit. Remember to  wash your fruits well before peeling or  eating. Vegetables All vegetables. Eat a variety of colors and types of vegetables. Remember to wash your vegetables well before peeling or eating. Grains All grains. Choose whole grains, such as whole-wheat bread, oatmeal, or brown rice. Meats and other protein foods Lean meats, including chicken, [place], fish, and lean cuts of beef, veal, or pork. If you eat fish or seafood, choose options that are higher in omega-3 fatty acids and lower in mercury, such as salmon, herring, mussels, trout, sardines, pollock, shrimp, crab, and lobster. Tofu. Tempeh. Beans. Eggs. Peanut butter and other nut butters. Make sure that all meats, poultry, and eggs are cooked to food-safe temperatures or "well-done." Two or more servings of fish are recommended each week in order to get the most benefits from omega-3 fatty acids that are found in seafood. Choose fish that are lower in mercury. You can find more information online:  [employer] Dairy Pasteurized milk and milk alternatives (such as almond milk). Pasteurized yogurt and pasteurized cheese. Cottage cheese. Sour cream. Beverages Water. Juices that contain 100% fruit juice or vegetable juice. Caffeine-free teas and decaffeinated coffee. Drinks that contain caffeine are okay to drink, but it is better to avoid caffeine. Keep your total caffeine intake to less than 200 mg each day (which is 12 oz or 355 mL of coffee, tea, or soda) or the limit as told by your health care provider. Fats and oils Fats and oils are okay to include in moderation. Sweets and desserts Sweets and desserts are okay to include in moderation. Seasoning and other foods All pasteurized condiments. The items listed above may not be a complete list of foods and beverages you can eat. Contact a dietitian for more information. What foods are not recommended? Fruits Unpasteurized fruit juices. Vegetables Raw (unpasteurized) vegetable juices. Meats and other protein foods Lunch meats,  bologna, hot dogs, or other deli meats. (If you must eat those meats, reheat them until they are steaming hot.) Refrigerated pat, meat spreads from a meat counter, smoked seafood that is found in the refrigerated section of a store. Raw or undercooked meats, poultry, and eggs. Raw fish, such as sushi or sashimi. Fish that have high mercury content, such as tilefish, shark, swordfish, and king mackerel. To learn more about mercury in fish, talk with your health care provider or look for online resources, such as:  [place] Dairy Raw (unpasteurized) milk and any foods that have raw milk in them. Soft cheeses, such as feta, queso blanco, queso fresco, Brie, Camembert cheeses, blue-veined cheeses, and Panela cheese (unless it is made with pasteurized milk, which must be stated on the label). Beverages Alcohol. Sugar-sweetened beverages, such as sodas, teas, or energy drinks. Seasoning and other foods Homemade fermented foods and drinks, such as pickles, sauerkraut, or kombucha drinks. (Store-bought pasteurized versions of these are okay.) Salads that are made in a store or deli, such as ham salad, chicken salad, egg salad, tuna salad, and seafood salad. The items listed above may not be a complete list of foods and beverages you should avoid. Contact a dietitian for more information. Where to find more information To calculate the number of calories you need based on your height, weight, and activity level, you can use an online calculator such as:  MobileTransition.ch To calculate how much weight you should gain during pregnancy, you can use an online pregnancy weight gain calculator such as:  Streaming[employer].cy Summary  While you are pregnant, your body requires additional nutrition to  help support your growing baby.  Eat a variety of foods, especially fruits and vegetables to get a full range of vitamins and minerals.  Practice good food  safety and cleanliness. Wash your hands before you eat and after you prepare raw meat. Wash all fruits and vegetables well before peeling or eating. Taking these actions can help to prevent food-borne illnesses, such as listeriosis, that can be very dangerous to your baby.  Do not eat raw meat or fish. Do not eat fish that have high mercury content, such as tilefish, shark, swordfish, and king mackerel. Do not eat unpasteurized (raw) dairy.  Take a prenatal vitamin to help meet your additional vitamin and mineral needs during pregnancy, specifically for folic acid, iron, calcium, and vitamin D. This information is not intended to replace advice given to you by your health care provider. Make sure you discuss any questions you have with your health care provider. Document Revised: 03/20/2019 Document Reviewed: 07/27/2017 Elsevier Patient Education  [place].

## 2020-07-15 LAB — BETA HCG QUANT (REF LAB): hCG Quant: 5725 m[IU]/mL

## 2020-07-22 ENCOUNTER — Encounter: Payer: Self-pay | Admitting: Physician Assistant

## 2020-07-23 ENCOUNTER — Telehealth: Payer: Self-pay

## 2020-08-27 DIAGNOSIS — Z3490 Encounter for supervision of normal pregnancy, unspecified, unspecified trimester: Secondary | ICD-10-CM | POA: Insufficient documentation

## 2020-08-30 DIAGNOSIS — Z87898 Personal history of other specified conditions: Secondary | ICD-10-CM | POA: Insufficient documentation

## 2020-08-30 DIAGNOSIS — Z872 Personal history of diseases of the skin and subcutaneous tissue: Secondary | ICD-10-CM | POA: Insufficient documentation

## 2020-10-27 ENCOUNTER — Encounter: Payer: Self-pay | Admitting: Physician Assistant

## 2020-10-28 DIAGNOSIS — U071 COVID-19: Secondary | ICD-10-CM | POA: Insufficient documentation

## 2020-10-29 ENCOUNTER — Encounter: Payer: Self-pay | Admitting: Family Medicine

## 2020-10-29 ENCOUNTER — Telehealth: Payer: BC Managed Care – PPO | Admitting: Family Medicine

## 2020-10-29 ENCOUNTER — Telehealth (INDEPENDENT_AMBULATORY_CARE_PROVIDER_SITE_OTHER): Payer: BC Managed Care – PPO | Admitting: Family Medicine

## 2020-10-29 DIAGNOSIS — U071 COVID-19: Secondary | ICD-10-CM | POA: Diagnosis not present

## 2020-10-29 NOTE — Patient Instructions (Signed)
COVID-19 COVID-19 is a respiratory infection that is caused by a virus called severe acute respiratory syndrome coronavirus 2 (SARS-CoV-2). The disease is also known as coronavirus disease or novel coronavirus. In some people, the virus may not cause any symptoms. In others, it may cause a serious infection. The infection can get worse quickly and can lead to complications, such as:  Pneumonia, or infection of the lungs.  Acute respiratory distress syndrome or ARDS. This is a condition in which fluid build-up in the lungs prevents the lungs from filling with air and passing oxygen into the blood.  Acute respiratory failure. This is a condition in which there is not enough oxygen passing from the lungs to the body or when carbon dioxide is not passing from the lungs out of the body.  Sepsis or septic shock. This is a serious bodily reaction to an infection.  Blood clotting problems.  Secondary infections due to bacteria or fungus.  Organ failure. This is when your body's organs stop working. The virus that causes COVID-19 is contagious. This means that it can spread from person to person through droplets from coughs and sneezes (respiratory secretions). What are the causes? This illness is caused by a virus. You may catch the virus by:  Breathing in droplets from an infected person. Droplets can be spread by a person breathing, speaking, singing, coughing, or sneezing.  Touching something, like a table or a doorknob, that was exposed to the virus (contaminated) and then touching your mouth, nose, or eyes. What increases the risk? Risk for infection You are more likely to be infected with this virus if you:  Are within 6 feet (2 meters) of a person with COVID-19.  Provide care for or live with a person who is infected with COVID-19.  Spend time in crowded indoor spaces or live in shared housing. Risk for serious illness You are more likely to become seriously ill from the virus if you:   Are 50 years of age or older. The higher your age, the more you are at risk for serious illness.  Live in a nursing home or long-term care facility.  Have cancer.  Have a long-term (chronic) disease such as: ? Chronic lung disease, including chronic obstructive pulmonary disease or asthma. ? A long-term disease that lowers your body's ability to fight infection (immunocompromised). ? Heart disease, including heart failure, a condition in which the arteries that lead to the heart become narrow or blocked (coronary artery disease), a disease which makes the heart muscle thick, weak, or stiff (cardiomyopathy). ? Diabetes. ? Chronic kidney disease. ? Sickle cell disease, a condition in which red blood cells have an abnormal "sickle" shape. ? Liver disease.  Are obese. What are the signs or symptoms? Symptoms of this condition can range from mild to severe. Symptoms may appear any time from 2 to 14 days after being exposed to the virus. They include:  A fever or chills.  A cough.  Difficulty breathing.  Headaches, body aches, or muscle aches.  Runny or stuffy (congested) nose.  A sore throat.  New loss of taste or smell. Some people may also have stomach problems, such as nausea, vomiting, or diarrhea. Other people may not have any symptoms of COVID-19. How is this diagnosed? This condition may be diagnosed based on:  Your signs and symptoms, especially if: ? You live in an area with a COVID-19 outbreak. ? You recently traveled to or from an area where the virus is common. ? You   provide care for or live with a person who was diagnosed with COVID-19. ? You were exposed to a person who was diagnosed with COVID-19.  A physical exam.  Lab tests, which may include: ? Taking a sample of fluid from the back of your nose and throat (nasopharyngeal fluid), your nose, or your throat using a swab. ? A sample of mucus from your lungs (sputum). ? Blood tests.  Imaging tests, which  may include, X-rays, CT scan, or ultrasound. How is this treated? At present, there is no medicine to treat COVID-19. Medicines that treat other diseases are being used on a trial basis to see if they are effective against COVID-19. Your health care provider will talk with you about ways to treat your symptoms. For most people, the infection is mild and can be managed at home with rest, fluids, and over-the-counter medicines. Treatment for a serious infection usually takes places in a hospital intensive care unit (ICU). It may include one or more of the following treatments. These treatments are given until your symptoms improve.  Receiving fluids and medicines through an IV.  Supplemental oxygen. Extra oxygen is given through a tube in the nose, a face mask, or a hood.  Positioning you to lie on your stomach (prone position). This makes it easier for oxygen to get into the lungs.  Continuous positive airway pressure (CPAP) or bi-level positive airway pressure (BPAP) machine. This treatment uses mild air pressure to keep the airways open. A tube that is connected to a motor delivers oxygen to the body.  Ventilator. This treatment moves air into and out of the lungs by using a tube that is placed in your windpipe.  Tracheostomy. This is a procedure to create a hole in the neck so that a breathing tube can be inserted.  Extracorporeal membrane oxygenation (ECMO). This procedure gives the lungs a chance to recover by taking over the functions of the heart and lungs. It supplies oxygen to the body and removes carbon dioxide. Follow these instructions at home: Lifestyle  If you are sick, stay home except to get medical care. Your health care provider will tell you how long to stay home. Call your health care provider before you go for medical care.  Rest at home as told by your health care provider.  Do not use any products that contain nicotine or tobacco, such as cigarettes, e-cigarettes, and  chewing tobacco. If you need help quitting, ask your health care provider.  Return to your normal activities as told by your health care provider. Ask your health care provider what activities are safe for you. General instructions  Take over-the-counter and prescription medicines only as told by your health care provider.  Drink enough fluid to keep your urine pale yellow.  Keep all follow-up visits as told by your health care provider. This is important. How is this prevented?  There is no vaccine to help prevent COVID-19 infection. However, there are steps you can take to protect yourself and others from this virus. To protect yourself:   Do not travel to areas where COVID-19 is a risk. The areas where COVID-19 is reported change often. To identify high-risk areas and travel restrictions, check the CDC travel website: wwwnc.cdc.gov/travel/notices  If you live in, or must travel to, an area where COVID-19 is a risk, take precautions to avoid infection. ? Stay away from people who are sick. ? Wash your hands often with soap and water for 20 seconds. If soap and water   are not available, use an alcohol-based hand sanitizer. ? Avoid touching your mouth, face, eyes, or nose. ? Avoid going out in public, follow guidance from your state and local health authorities. ? If you must go out in public, wear a cloth face covering or face mask. Make sure your mask covers your nose and mouth. ? Avoid crowded indoor spaces. Stay at least 6 feet (2 meters) away from others. ? Disinfect objects and surfaces that are frequently touched every day. This may include:  Counters and tables.  Doorknobs and light switches.  Sinks and faucets.  Electronics, such as phones, remote controls, keyboards, computers, and tablets. To protect others: If you have symptoms of COVID-19, take steps to prevent the virus from spreading to others.  If you think you have a COVID-19 infection, contact your health care  provider right away. Tell your health care team that you think you may have a COVID-19 infection.  Stay home. Leave your house only to seek medical care. Do not use public transport.  Do not travel while you are sick.  Wash your hands often with soap and water for 20 seconds. If soap and water are not available, use alcohol-based hand sanitizer.  Stay away from other members of your household. Let healthy household members care for children and pets, if possible. If you have to care for children or pets, wash your hands often and wear a mask. If possible, stay in your own room, separate from others. Use a different bathroom.  Make sure that all people in your household wash their hands well and often.  Cough or sneeze into a tissue or your sleeve or elbow. Do not cough or sneeze into your hand or into the air.  Wear a cloth face covering or face mask. Make sure your mask covers your nose and mouth. Where to find more information  Centers for Disease Control and Prevention: www.cdc.gov/coronavirus/2019-ncov/index.html  World Health Organization: www.who.int/health-topics/coronavirus Contact a health care provider if:  You live in or have traveled to an area where COVID-19 is a risk and you have symptoms of the infection.  You have had contact with someone who has COVID-19 and you have symptoms of the infection. Get help right away if:  You have trouble breathing.  You have pain or pressure in your chest.  You have confusion.  You have bluish lips and fingernails.  You have difficulty waking from sleep.  You have symptoms that get worse. These symptoms may represent a serious problem that is an emergency. Do not wait to see if the symptoms will go away. Get medical help right away. Call your local emergency services (911 in the [place] Do not drive yourself to the hospital. Let the emergency medical personnel know if you think you have COVID-19. Summary  COVID-19 is a  respiratory infection that is caused by a virus. It is also known as coronavirus disease or novel coronavirus. It can cause serious infections, such as pneumonia, acute respiratory distress syndrome, acute respiratory failure, or sepsis.  The virus that causes COVID-19 is contagious. This means that it can spread from person to person through droplets from breathing, speaking, singing, coughing, or sneezing.  You are more likely to develop a serious illness if you are 50 years of age or older, have a weak immune system, live in a nursing home, or have chronic disease.  There is no medicine to treat COVID-19. Your health care provider will talk with you about ways to treat your symptoms.    Take steps to protect yourself and others from infection. Wash your hands often and disinfect objects and surfaces that are frequently touched every day. Stay away from people who are sick and wear a mask if you are sick. This information is not intended to replace advice given to you by your health care provider. Make sure you discuss any questions you have with your health care provider. Document Revised: 08/29/2019 Document Reviewed: 12/05/2018 Elsevier Patient Education  2020 Elsevier Inc.  

## 2020-10-29 NOTE — Progress Notes (Signed)
MyChart Video Visit    Virtual Visit via Video Note   This visit type was conducted due to national recommendations for restrictions regarding the COVID-19 Pandemic (e.g. social distancing) in an effort to limit this patient's exposure and mitigate transmission in our community. This patient is at least at moderate risk for complications without adequate follow up. This format is felt to be most appropriate for this patient at this time. Physical exam was limited by quality of the video and audio technology used for the visit.    Patient location: home Provider location: [place] involved in the visit: patient, provider  I discussed the limitations of evaluation and management by telemedicine and the availability of in person appointments. The patient expressed understanding and agreed to proceed.  Patient: Margaret Grant   DOB: 12/30/1995   24 y.o. Female  MRN: [id number] Visit Date: 10/29/2020  Today's healthcare provider: Lavon Paganini, MD   No chief complaint on file.  Subjective    HPI    Tested positive for COVID.  Wants to know what to do for symptom management  Loss of taste and smell and congestion. Headaches and sinus pressure are doing better.  Mild cough, nonproductive. No fever, SOB.  She is currently pregnant [redacted]wks gestation with 1st baby.  Symptoms started 12/13 with sneezing and congestion.  Tested Monday.  Patient Active Problem List   Diagnosis Date Noted  . Mixed hyperlipidemia 03/14/2018  . Class 2 obesity due to excess calories without serious comorbidity with body mass index (BMI) of 35.0 to 35.9 in adult 03/14/2018   Past Medical History:  Diagnosis Date  . Allergy   . Anxiety   . Asthma   . Labial cyst   . Panic attacks   . Pilonidal cyst    Social History   Tobacco Use  . Smoking status: Former Smoker    Types: Cigarettes  . Smokeless tobacco: Former Network engineer  . Vaping Use: Every day  .  Start date: 03/14/2016  Substance Use Topics  . Alcohol use: Yes  . Drug use: Never   No Known Allergies  Medications: Outpatient Medications Prior to Visit  Medication Sig  . albuterol (VENTOLIN HFA) 108 (90 Base) MCG/ACT inhaler Inhale 2 puffs into the lungs every 6 (six) hours as needed for wheezing or shortness of breath.  . fluticasone (FLONASE) 50 MCG/ACT nasal spray Place 2 sprays into both nostrils daily.   No facility-administered medications prior to visit.    Review of Systems  HENT: Positive for postnasal drip, rhinorrhea and sinus pressure. Negative for congestion, ear discharge, ear pain, hearing loss, sinus pain and sore throat.   Respiratory: Positive for cough. Negative for apnea, choking, chest tightness, shortness of breath, wheezing and stridor.   Gastrointestinal: Negative.   Neurological: Negative for dizziness and headaches.      Objective    There were no vitals taken for this visit.   Physical Exam Constitutional:      General: She is not in acute distress.    Appearance: Normal appearance.  HENT:     Head: Normocephalic and atraumatic.  Pulmonary:     Effort: Pulmonary effort is normal. No respiratory distress.  Neurological:     Mental Status: She is alert and oriented to person, place, and time. Mental status is at baseline.  Psychiatric:        Mood and Affect: Mood normal.        Behavior: Behavior normal.  Assessment & Plan     1. COVID-19 - Positive for COVID 19 - discussed need to quarantine 10 days from start of symptoms and until fever-free for at least 48 hours - discussed need to quarantine household members - discussed symptomatic management, natural course, and return precautions - discussed monoclonal antibody treatment and placed referral   Return if symptoms worsen or fail to improve.     I discussed the assessment and treatment plan with the patient. The patient was provided an opportunity to ask questions and  all were answered. The patient agreed with the plan and demonstrated an understanding of the instructions.   The patient was advised to call back or seek an in-person evaluation if the symptoms worsen or if the condition fails to improve as anticipated.  I provided 30 minutes of non-face-to-face time during this encounter.  I, Lavon Paganini, MD, have reviewed all documentation for this visit. The documentation on 10/29/20 for the exam, diagnosis, procedures, and orders are all accurate and complete.   Alara Daniel, Dionne Bucy, MD, MPH [facility]

## 2020-10-30 ENCOUNTER — Encounter: Payer: Self-pay | Admitting: Infectious Diseases

## 2020-10-30 ENCOUNTER — Telehealth: Payer: Self-pay | Admitting: Infectious Diseases

## 2020-10-30 NOTE — Telephone Encounter (Signed)
Called to Discuss with patient about Covid symptoms and the use of the monoclonal antibody infusion for those with mild to moderate Covid symptoms and at a high risk of hospitalization.     Pt appears to qualify for this infusion due to co-morbid conditions including and/or a member of an at-risk group in accordance with the FDA Emergency Use Authorization.    Unable to reach pt - LVM and sent mychart message.   Qualifiers: CURRENT PREGNANCY, OBESITY, SMOKING HISTORY   Sx onset: 10/25/2020 (can schedule through Monday 12/20 if she gives [place] a call back.

## 2020-10-31 ENCOUNTER — Encounter: Payer: Self-pay | Admitting: Physician Assistant

## 2020-10-31 ENCOUNTER — Other Ambulatory Visit (HOSPITAL_COMMUNITY): Payer: Self-pay | Admitting: Physician Assistant

## 2020-10-31 NOTE — Progress Notes (Unsigned)
I connected by phone with Margaret Grant on 10/31/2020 at 10:47 AM to discuss the potential use of a new treatment for mild to moderate COVID-19 viral infection in non-hospitalized patients.  This patient is a 24 y.o. female that meets the FDA criteria for Emergency Use Authorization of COVID monoclonal antibody casirivimab/imdevimab, bamlanivimab/etesevimab, or sotrovimab.  Has a (+) direct SARS-CoV-2 viral test result  Has mild or moderate COVID-19   Is NOT hospitalized due to COVID-19  Is within 10 days of symptom onset  Has at least one of the high risk factor(s) for progression to severe COVID-19 and/or hospitalization as defined in EUA.  Specific high risk criteria : Pregnancy   I have spoken and communicated the following to the patient or parent/caregiver regarding COVID monoclonal antibody treatment:  1. FDA has authorized the emergency use for the treatment of mild to moderate COVID-19 in adults and pediatric patients with positive results of direct SARS-CoV-2 viral testing who are 41 years of age and older weighing at least 40 kg, and who are at high risk for progressing to severe COVID-19 and/or hospitalization.  2. The significant known and potential risks and benefits of COVID monoclonal antibody, and the extent to which such potential risks and benefits are unknown.  3. Information on available alternative treatments and the risks and benefits of those alternatives, including clinical trials.  4. Patients treated with COVID monoclonal antibody should continue to self-isolate and use infection control measures (e.g., wear mask, isolate, social distance, avoid sharing personal items, clean and disinfect "high touch" surfaces, and frequent handwashing) according to CDC guidelines.   5. The patient or parent/caregiver has the option to accept or refuse COVID monoclonal antibody treatment.  After reviewing this information with the patient, the patient has agreed to  receive one of the available covid 19 monoclonal antibodies and will be provided an appropriate fact sheet prior to infusion. Konrad Felix, PA-C 10/31/2020 10:47 AM

## 2020-11-01 ENCOUNTER — Ambulatory Visit (HOSPITAL_COMMUNITY)
Admission: RE | Admit: 2020-11-01 | Discharge: 2020-11-01 | Disposition: A | Discharge status: Home / Self Care | Payer: BC Managed Care – PPO | Source: Ambulatory Visit | Attending: Pulmonary Disease | Admitting: Pulmonary Disease

## 2020-11-01 DIAGNOSIS — U071 COVID-19: Secondary | ICD-10-CM | POA: Insufficient documentation

## 2020-11-01 MED ORDER — METHYLPREDNISOLONE SODIUM SUCC 125 MG IJ SOLR: 125.0000 mg | Freq: Once | INTRAMUSCULAR | Status: DC | PRN

## 2020-11-01 MED ORDER — EPINEPHRINE 0.3 MG/0.3ML IJ SOAJ: 0.3000 mg | Freq: Once | INTRAMUSCULAR | Status: DC | PRN

## 2020-11-01 MED ORDER — DIPHENHYDRAMINE HCL 50 MG/ML IJ SOLN: 50.0000 mg | Freq: Once | INTRAMUSCULAR | Status: DC | PRN

## 2020-11-01 MED ORDER — SODIUM CHLORIDE 0.9 % IV SOLN: INTRAVENOUS | Status: DC | PRN

## 2020-11-01 MED ORDER — SODIUM CHLORIDE 0.9 % IV SOLN: Freq: Once | INTRAVENOUS | Status: AC

## 2020-11-01 MED ORDER — ALBUTEROL SULFATE HFA 108 (90 BASE) MCG/ACT IN AERS: 2.0000 | INHALATION_SPRAY | Freq: Once | RESPIRATORY_TRACT | Status: DC | PRN

## 2020-11-01 MED ORDER — FAMOTIDINE IN NACL 20-0.9 MG/50ML-% IV SOLN
20.0000 mg | Freq: Once | INTRAVENOUS | Status: DC | PRN
Start: 2020-11-01 — End: 2020-11-02

## 2020-11-01 NOTE — Progress Notes (Signed)
Patient reviewed Fact Sheet for Patients, Parents, and Caregivers for Emergency Use Authorization (EUA) of bamlanivimab and etesevimab for the Treatment of Coronavirus. Patient also reviewed and is agreeable to the estimated cost of treatment. Patient is agreeable to proceed.   

## 2020-11-01 NOTE — Progress Notes (Signed)
  Diagnosis: COVID-19  Physician: Dr. Asencion Noble   Procedure: Medication fact sheet provided to patient; all questions answered.  Allergies reviewed with patient.  IV placed.  Bamlanivimab and etesevimab administered via IV infusion.   Complications: No immediate complications noted.  Discharge: Discharged home   Monna Fam 11/01/2020

## 2020-11-01 NOTE — Discharge Instructions (Signed)
10 Things You Can Do to Manage Your COVID-19 Symptoms at Home If you have possible or confirmed COVID-19: 1. Stay home from work and school. And stay away from other public places. If you must go out, avoid using any kind of public transportation, ridesharing, or taxis. 2. Monitor your symptoms carefully. If your symptoms get worse, call your healthcare provider immediately. 3. Get rest and stay hydrated. 4. If you have a medical appointment, call the healthcare provider ahead of time and tell them that you have or may have COVID-19. 5. For medical emergencies, call 911 and notify the dispatch personnel that you have or may have COVID-19. 6. Cover your cough and sneezes with a tissue or use the inside of your elbow. 7. Wash your hands often with soap and water for at least 20 seconds or clean your hands with an alcohol-based hand sanitizer that contains at least 60% alcohol. 8. As much as possible, stay in a specific room and away from other people in your home. Also, you should use a separate bathroom, if available. If you need to be around other people in or outside of the home, wear a mask. 9. Avoid sharing personal items with other people in your household, like dishes, towels, and bedding. 10. Clean all surfaces that are touched often, like counters, tabletops, and doorknobs. Use household cleaning sprays or wipes according to the label instructions. cdc.gov/coronavirus 05/14/2019 This information is not intended to replace advice given to you by your health care provider. Make sure you discuss any questions you have with your health care provider. Document Revised: 10/16/2019 Document Reviewed: 10/16/2019 Elsevier Patient Education  2020 Elsevier Inc. What types of side effects do monoclonal antibody drugs cause?  Common side effects  In general, the more common side effects caused by monoclonal antibody drugs include: . Allergic reactions, such as hives or itching . Flu-like signs and  symptoms, including chills, fatigue, fever, and muscle aches and pains . Nausea, vomiting . Diarrhea . Skin rashes . Low blood pressure   The CDC is recommending patients who receive monoclonal antibody treatments wait at least 90 days before being vaccinated.  Currently, there are no data on the safety and efficacy of mRNA COVID-19 vaccines in persons who received monoclonal antibodies or convalescent plasma as part of COVID-19 treatment. Based on the estimated half-life of such therapies as well as evidence suggesting that reinfection is uncommon in the 90 days after initial infection, vaccination should be deferred for at least 90 days, as a precautionary measure until additional information becomes available, to avoid interference of the antibody treatment with vaccine-induced immune responses. If you have any questions or concerns after the infusion please call the Advanced Practice Provider on call at [phone]. This number is ONLY intended for your use regarding questions or concerns about the infusion post-treatment side-effects.  Please do not provide this number to others for use. For return to work notes please contact your primary care provider.   If someone you know is interested in receiving treatment please have them call the COVID hotline at [phone].   

## 2021-01-13 ENCOUNTER — Telehealth (INDEPENDENT_AMBULATORY_CARE_PROVIDER_SITE_OTHER): Payer: BC Managed Care – PPO | Admitting: Adult Health

## 2021-01-13 ENCOUNTER — Encounter: Payer: Self-pay | Admitting: Physician Assistant

## 2021-01-13 ENCOUNTER — Encounter: Payer: Self-pay | Admitting: Adult Health

## 2021-01-13 DIAGNOSIS — J4520 Mild intermittent asthma, uncomplicated: Secondary | ICD-10-CM

## 2021-01-13 DIAGNOSIS — Z87898 Personal history of other specified conditions: Secondary | ICD-10-CM

## 2021-01-13 DIAGNOSIS — J069 Acute upper respiratory infection, unspecified: Secondary | ICD-10-CM

## 2021-01-13 DIAGNOSIS — Z3A32 32 weeks gestation of pregnancy: Secondary | ICD-10-CM

## 2021-01-13 NOTE — Patient Instructions (Signed)
Diarrhea, Adult Diarrhea is when you pass loose and watery poop (stool) often. Diarrhea can make you feel weak and cause you to lose water in your body (get dehydrated). Losing water in your body can cause you to:  Feel tired and thirsty.  Have a dry mouth.  Go pee (urinate) less often. Diarrhea often lasts 2-3 days. However, it can last longer if it is a sign of something more serious. It is important to treat your diarrhea as told by your doctor. Follow these instructions at home: Eating and drinking Follow these instructions as told by your doctor:  Take an ORS (oral rehydration solution). This is a drink that helps you replace fluids and minerals your body lost. It is sold at pharmacies and stores.  Drink plenty of fluids, such as: ? Water. ? Ice chips. ? Diluted fruit juice. ? Low-calorie sports drinks. ? Milk, if you want.  Avoid drinking fluids that have a lot of sugar or caffeine in them.  Eat bland, easy-to-digest foods in small amounts as you are able. These foods include: ? Bananas. ? Applesauce. ? Rice. ? Low-fat (lean) meats. ? Toast. ? Crackers.  Avoid alcohol.  Avoid spicy or fatty foods.      Medicines  Take over-the-counter and prescription medicines only as told by your doctor.  If you were prescribed an antibiotic medicine, take it as told by your doctor. Do not stop using the antibiotic even if you start to feel better. General instructions  Wash your hands often using soap and water. If soap and water are not available, use a hand sanitizer. Others in your home should wash their hands as well. Hands should be washed: ? After using the toilet or changing a diaper. ? Before preparing, cooking, or serving food. ? While caring for a sick person. ? While visiting someone in a hospital.  Drink enough fluid to keep your pee (urine) pale yellow.  Rest at home while you get better.  Watch your condition for any changes.  Take a warm bath to help  with any burning or pain from having diarrhea.  Keep all follow-up visits as told by your doctor. This is important.   Contact a doctor if:  You have a fever.  Your diarrhea gets worse.  You have new symptoms.  You cannot keep fluids down.  You feel light-headed or dizzy.  You have a headache.  You have muscle cramps. Get help right away if:  You have chest pain.  You feel very weak or you pass out (faint).  You have bloody or black poop or poop that looks like tar.  You have very bad pain, cramping, or bloating in your belly (abdomen).  You have trouble breathing or you are breathing very quickly.  Your heart is beating very quickly.  Your skin feels cold and clammy.  You feel confused.  You have signs of losing too much water in your body, such as: ? Dark pee, very little pee, or no pee. ? Cracked lips. ? Dry mouth. ? Sunken eyes. ? Sleepiness. ? Weakness. Summary  Diarrhea is when you pass loose and watery poop (stool) often.  Diarrhea can make you feel weak and cause you to lose water in your body (get dehydrated).  Take an ORS (oral rehydration solution). This is a drink that is sold at pharmacies and stores.  Eat bland, easy-to-digest foods in small amounts as you are able.  Contact a doctor if your condition gets worse. Get help   right away if you have signs that you have lost too much water in your body. This information is not intended to replace advice given to you by your health care provider. Make sure you discuss any questions you have with your health care provider. Document Revised: 04/05/2018 Document Reviewed: 04/05/2018 Elsevier Patient Education  2021 [place]. Upper Respiratory Infection, Adult An upper respiratory infection (URI) affects the nose, throat, and upper air passages. URIs are caused by germs (viruses). The most common type of URI is often called "the common cold." Medicines cannot cure URIs, but you can do things at home  to relieve your symptoms. URIs usually get better within 7-10 days. Follow these instructions at home: Activity  Rest as needed.  If you have a fever, stay home from work or school until your fever is gone, or until your doctor says you may return to work or school. ? You should stay home until you cannot spread the infection anymore (you are not contagious). ? Your doctor may have you wear a face mask so you have less risk of spreading the infection. Relieving symptoms  Gargle with a salt-water mixture 3-4 times a day or as needed. To make a salt-water mixture, completely dissolve -1 tsp of salt in 1 cup of warm water.  Use a cool-mist humidifier to add moisture to the air. This can help you breathe more easily. Eating and drinking  Drink enough fluid to keep your pee (urine) pale yellow.  Eat soups and other clear broths.   General instructions  Take over-the-counter and prescription medicines only as told by your doctor. These include cold medicines, fever reducers, and cough suppressants.  Do not use any products that contain nicotine or tobacco. These include cigarettes and e-cigarettes. If you need help quitting, ask your doctor.  Avoid being where people are smoking (avoid secondhand smoke).  Make sure you get regular shots and get the flu shot every year.  Keep all follow-up visits as told by your doctor. This is important.   How to avoid spreading infection to others  Wash your hands often with soap and water. If you do not have soap and water, use hand sanitizer.  Avoid touching your mouth, face, eyes, or nose.  Cough or sneeze into a tissue or your sleeve or elbow. Do not cough or sneeze into your hand or into the air.   Contact a doctor if:  You are getting worse, not better.  You have any of these: ? A fever. ? Chills. ? Brown or red mucus in your nose. ? Yellow or brown fluid (discharge)coming from your nose. ? Pain in your face, especially when you bend  forward. ? Swollen neck glands. ? Pain with swallowing. ? White areas in the back of your throat. Get help right away if:  You have shortness of breath that gets worse.  You have very bad or constant: ? Headache. ? Ear pain. ? Pain in your forehead, behind your eyes, and over your cheekbones (sinus pain). ? Chest pain.  You have long-lasting (chronic) lung disease along with any of these: ? Wheezing. ? Long-lasting cough. ? Coughing up blood. ? A change in your usual mucus.  You have a stiff neck.  You have changes in your: ? Vision. ? Hearing. ? Thinking. ? Mood. Summary  An upper respiratory infection (URI) is caused by a germ called a virus. The most common type of URI is often called "the common cold."  URIs usually get  better within 7-10 days.  Take over-the-counter and prescription medicines only as told by your doctor. This information is not intended to replace advice given to you by your health care provider. Make sure you discuss any questions you have with your health care provider. Document Revised: 07/08/2020 Document Reviewed: 07/08/2020 Elsevier Patient Education  2021 [place] for Pregnant Women While you are pregnant, your body requires additional nutrition to help support your growing baby. You also have a higher need for some vitamins and minerals, such as folic acid, calcium, iron, and vitamin D. Eating a healthy, well-balanced diet is very important for your health and your baby's health. Your need for extra calories varies over the course of your pregnancy. Pregnancy is divided into three trimesters, with each trimester lasting 3 months. For most women, it is recommended to consume:  150 extra calories a day during the first trimester.  300 extra calories a day during the second trimester.  300 extra calories a day during the third trimester. What are tips for following this plan? Cooking  Practice good food safety and  cleanliness. Wash your hands before you eat and after you prepare raw meat. Wash all fruits and vegetables well before peeling or eating. Taking these actions can help to prevent foodborne illnesses that can be very dangerous to your baby, such as listeriosis. Ask your health care provider for more information about listeriosis.  Make sure that all meats, poultry, and eggs are cooked to food-safe temperatures or "well-done." Meal planning  Eat a variety of foods (especially fruits and vegetables) to get a full range of vitamins and minerals.  Two or more servings of fish are recommended each week in order to get the most benefits from omega-3 fatty acids that are found in seafood. Choose fish that are lower in mercury, such as salmon and pollock.  Limit your overall intake of foods that have "empty calories." These are foods that have little nutritional value, such as sweets, desserts, candies, and sugar-sweetened beverages.  Drinks that contain caffeine are okay to drink, but it is better to avoid caffeine. Keep your total caffeine intake to less than 200 mg each day (which is 12 oz or 355 mL of coffee, tea, or soda) or the limit as told by your health care provider.   General information  Do not try to lose weight or go on a diet during pregnancy.  Take a prenatal vitamin to help meet your additional vitamin and mineral needs during pregnancy, specifically for folic acid, iron, calcium, and vitamin D.  Remember to stay active. Ask your health care provider what types of exercise and activities are safe for you. What does 150 extra calories look like? Healthy options that provide 150 extra calories each day could be any of the following:  6-8 oz (170-227 g) plain low-fat yogurt with  cup (70 g) berries.  1 apple with 2 tsp (11 g) peanut butter.  Cut-up vegetables with  cup (60 g) hummus.  8 fl oz (237 mL) low-fat chocolate milk.  1 stick of string cheese with 1 medium orange.  1  peanut butter and jelly sandwich that is made with one slice of whole-wheat bread and 1 tsp (5 g) of peanut butter. For 300 extra calories, you could eat two of these healthy options each day. What is a healthy amount of weight to gain? The right amount of weight gain for you is based on your BMI (body mass index) before you became  pregnant.  If your BMI was less than 18 (underweight), you should gain 28-40 lb (13-18 kg).  If your BMI was 18-24.9 (normal), you should gain 25-35 lb (11-16 kg).  If your BMI was 25-29.9 (overweight), you should gain 15-25 lb (7-11 kg).  If your BMI was 30 or greater (obese), you should gain 11-20 lb (5-9 kg). What if I am having twins or multiples? Generally, if you are carrying twins or multiples:  You may need to eat 300-600 extra calories a day.  The recommended range for total weight gain is 25-54 lb (11-25 kg), depending on your BMI before pregnancy.  Talk with your health care provider to find out about nutritional needs, weight gain, and exercise that is right for you. What foods should I eat? Fruits All fruits. Eat a variety of colors and types of fruit. Remember to wash your fruits well before peeling or eating. Vegetables All vegetables. Eat a variety of colors and types of vegetables. Remember to wash your vegetables well before peeling or eating. Grains All grains. Choose whole grains, such as whole-wheat bread, oatmeal, or brown rice. Meats and other protein foods Lean meats, including chicken, [place], and lean cuts of beef, veal, or pork. Fish that is higher in omega-3 fatty acids and lower in mercury, such as salmon, herring, mussels, trout, sardines, pollock, shrimp, crab, and lobster. Tofu. Tempeh. Beans. Eggs. Peanut butter and other nut butters. Dairy Pasteurized milk and milk alternatives, such as almond milk. Pasteurized yogurt and pasteurized cheese. Cottage cheese. Sour cream. Beverages Water. Juices that contain 100% fruit juice  or vegetable juice. Caffeine-free teas and decaffeinated coffee. Fats and oils Fats and oils are okay to include in moderation. Sweets and desserts Sweets and desserts are okay to include in moderation. Seasoning and other foods All pasteurized condiments. The items listed above may not be a complete list of foods and beverages you can eat. Contact a dietitian for more information.   What foods should I avoid? Fruits Raw (unpasteurized) fruit juices. Vegetables Unpasteurized vegetable juices. Meats and other protein foods Precooked or cured meat, such as bologna, hot dogs, sausages, or meat loaves. (If you must eat those meats, reheat them until they are steaming hot.) Refrigerated pate, meat spreads from a meat counter, or smoked seafood that is found in the refrigerated section of a store. Raw or undercooked meats, poultry, and eggs. Raw fish, such as sushi or sashimi. Fish that have high mercury content, such as tilefish, shark, swordfish, and king mackerel. Dairy Unpasteurized milk and any foods that have unpasteurized milk in them. Soft cheeses, such as feta, queso blanco, queso fresco, [place], [place], panela, and blue-veined cheeses (unless they are made with pasteurized milk, which must be stated on the label). Beverages Alcohol. Sugar-sweetened beverages, such as sodas, teas, or energy drinks. Seasoning and other foods Homemade fermented foods and drinks, such as pickles, sauerkraut, or kombucha drinks. (Store-bought pasteurized versions of these are okay.) Salads that are made in a store or deli, such as ham salad, chicken salad, egg salad, tuna salad, and seafood salad. The items listed above may not be a complete list of foods and beverages you should avoid. Contact a dietitian for more information. Where to find more information To calculate the number of calories you need based on your height, weight, and activity level, you can use an online calculator such  as:  https://www.hunter.com/ To calculate how much weight you should gain during pregnancy, you can use an online pregnancy weight gain  calculator such as:  [employer]es To learn more about eating fish during pregnancy, talk with your health care provider or visit:  [email or web address] Summary  While you are pregnant, your body requires additional nutrition to help support your growing baby.  Eat a variety of foods, especially fruits and vegetables, to get a full range of vitamins and minerals.  Practice good food safety and cleanliness. Wash your hands before you eat and after you prepare raw meat. Wash all fruits and vegetables well before peeling or eating. Taking these actions can help to prevent foodborne illnesses, such as listeriosis, that can be very dangerous to your baby.  Do not eat raw meat or fish. Do not eat fish that have high mercury content, such as tilefish, shark, swordfish, and king mackerel. Do not eat raw (unpasteurized) dairy.  Take a prenatal vitamin to help meet your additional vitamin and mineral needs during pregnancy, specifically for folic acid, iron, calcium, and vitamin D. This information is not intended to replace advice given to you by your health care provider. Make sure you discuss any questions you have with your health care provider. Document Revised: 05/27/2020 Document Reviewed: 05/27/2020 Elsevier Patient Education  [place].

## 2021-01-13 NOTE — Progress Notes (Signed)
MyChart Video Visit    Virtual Visit via Video Note   This visit type was conducted due to national recommendations for restrictions regarding the COVID-19 Pandemic (e.g. social distancing) in an effort to limit this patient's exposure and mitigate transmission in our community. This patient is at least at moderate risk for complications without adequate follow up. This format is felt to be most appropriate for this patient at this time. Physical exam was limited by quality of the video and audio technology used for the visit.   Parties involved in visit as below:    Patient location: at home  Provider location: Provider: Provider's office at  [facility], [place].  I discussed the limitations of evaluation and management by telemedicine and the availability of in person appointments. The patient expressed understanding and agreed to proceed.  Patient: Margaret Grant   DOB: 01/28/1996   25 y.o. Female  MRN: [id number] Visit Date: 01/13/2021  Today's healthcare provider: Marcille Buffy, FNP   Chief Complaint  Patient presents with  . URI   Subjective    URI  This is a new problem. The current episode started today. There has been no fever. Associated symptoms include chest pain, coughing, diarrhea and wheezing. Pertinent negatives include no abdominal pain, congestion, dysuria, ear pain, headaches, joint pain, joint swelling, nausea, neck pain, plugged ear sensation, rash, rhinorrhea, sinus pain, sneezing, sore throat, swollen glands or vomiting. She has tried nothing for the symptoms.    [redacted] weeks pregnant, healthy pregnancy, without complications, she did have covid in september and other than that hs been well. She reports she has good fetal movement and kicks. Denies any abdominal or back pain.    She has no fever or chills, she has cough today, she has discomfort with coughing and deep breathing. Denies any history of blood clots. She is also  having some mild diarrhea.  Denies any contractions, rectal or vaginal bleeding.  She denies any covid or other exposures. She did rapid Covid test this morning and was negative.  She has not been on any recent antibiotics or any other recent treatment.  She denies any coughing any blood.   She has albuterol inhaler and Obstetrician  was ok with her still using it.  Onset day today of symptoms.   Denies any ill contacts in home.  Patient  denies any fever, body aches,chills, rash, chest pain, shortness of breath, nausea, vomiting, or diarrhea.   Denies dizziness, lightheadedness, pre syncopal or syncopal episodes.   Patient Active Problem List   Diagnosis Date Noted  . Pregnancy with 32 completed weeks gestation 01/13/2021  . Viral upper respiratory tract infection 01/13/2021  . COVID-19 10/28/2020  . H/O pilonidal cyst 08/30/2020  . History of wheezing 08/30/2020  . Encounter for supervision of normal pregnancy, antepartum 08/27/2020  . Mixed hyperlipidemia 03/14/2018  . Class 2 obesity due to excess calories without serious comorbidity with body mass index (BMI) of 35.0 to 35.9 in adult 03/14/2018  . Mild intermittent asthma without complication 66/44/0347   Past Medical History:  Diagnosis Date  . Allergy   . Anxiety   . Asthma   . Labial cyst   . Panic attacks   . Pilonidal cyst    Past Surgical History:  Procedure Laterality Date  . KNEE ARTHROSCOPY W/ MENISCAL REPAIR  2012  . WISDOM TOOTH EXTRACTION  2017   Social History   Tobacco Use  . Smoking status: Former Smoker  Types: Cigarettes  . Smokeless tobacco: Former Network engineer  . Vaping Use: Every day  . Start date: 03/14/2016  Substance Use Topics  . Alcohol use: Yes  . Drug use: Never   Social History   Socioeconomic History  . Marital status: Single    Spouse name: Not on file  . Number of children: 0  . Years of education: Not on file  . Highest education level: Not on file  Occupational  History  . Not on file  Tobacco Use  . Smoking status: Former Smoker    Types: Cigarettes  . Smokeless tobacco: Former Network engineer  . Vaping Use: Every day  . Start date: 03/14/2016  Substance and Sexual Activity  . Alcohol use: Yes  . Drug use: Never  . Sexual activity: Yes    Birth control/protection: I.U.D.  Other Topics Concern  . Not on file  Social History Narrative  . Not on file   Social Determinants of Health   Financial Resource Strain: Not on file  Food Insecurity: Not on file  Transportation Needs: Not on file  Physical Activity: Not on file  Stress: Not on file  Social Connections: Not on file  Intimate Partner Violence: Not on file   Family Status  Relation Name Status  . Mother  (Not Specified)  . Father  (Not Specified)  . Mat Aunt  (Not Specified)  . Mat Uncle  (Not Specified)  . MGM  (Not Specified)  . MGF  (Not Specified)  . Cousin  (Not Specified)   Family History  Problem Relation Age of Onset  . Hypertension Mother   . Anxiety disorder Mother   . Depression Mother   . Drug abuse Father   . Cancer Maternal Aunt        Brain  . Cancer Maternal Uncle        Lung  . Stroke Maternal Grandmother   . COPD Maternal Grandmother   . Hypotension Maternal Grandmother   . Cancer Maternal Grandfather        Lung  . Hypertension Maternal Grandfather   . Anxiety disorder Cousin   . Depression Cousin   . Gout Cousin    No Known Allergies    Medications: Outpatient Medications Prior to Visit  Medication Sig  . albuterol (VENTOLIN HFA) 108 (90 Base) MCG/ACT inhaler Inhale 2 puffs into the lungs every 6 (six) hours as needed for wheezing or shortness of breath.  . fluticasone (FLONASE) 50 MCG/ACT nasal spray Place 2 sprays into both nostrils daily.   No facility-administered medications prior to visit.    Review of Systems  HENT: Negative for congestion, ear pain, rhinorrhea, sinus pain, sneezing and sore throat.   Respiratory: Positive  for cough and wheezing.   Cardiovascular: Positive for chest pain.  Gastrointestinal: Positive for diarrhea. Negative for abdominal pain, nausea and vomiting.  Genitourinary: Negative for dysuria.  Musculoskeletal: Negative for joint pain and neck pain.  Skin: Negative for rash.  Neurological: Negative for headaches.    Last CBC Lab Results  Component Value Date   WBC 8.1 10/23/2019   HGB 14.2 10/23/2019   HCT 41.2 10/23/2019   MCV 86 10/23/2019   MCH 29.7 10/23/2019   RDW 12.7 10/23/2019   PLT 388 12/45/8099   Last metabolic panel Lab Results  Component Value Date   GLUCOSE 86 10/23/2019   NA 140 10/23/2019   K 4.1 10/23/2019   CL 103 10/23/2019   CO2 21  10/23/2019   BUN 12 10/23/2019   CREATININE 0.83 10/23/2019   GFRNONAA 100 10/23/2019   GFRAA 115 10/23/2019   CALCIUM 9.4 10/23/2019   PROT 6.7 10/23/2019   ALBUMIN 4.2 10/23/2019   LABGLOB 2.5 10/23/2019   AGRATIO 1.7 10/23/2019   BILITOT 0.2 10/23/2019   ALKPHOS 47 10/23/2019   AST 17 10/23/2019   ALT 18 10/23/2019   Last lipids Lab Results  Component Value Date   CHOL 221 (H) 10/23/2019   HDL 42 10/23/2019   LDLCALC 138 (H) 10/23/2019   TRIG 229 (H) 10/23/2019   CHOLHDL 5.3 (H) 10/23/2019   Last hemoglobin A1c No results found for: HGBA1C Last thyroid functions Lab Results  Component Value Date   TSH 1.490 03/14/2018   Last vitamin D No results found for: 25OHVITD2, 25OHVITD3, VD25OH Last vitamin B12 and Folate No results found for: VITAMINB12, FOLATE    Objective    There were no vitals taken for this visit. BP Readings from Last 3 Encounters:  11/01/20 125/89  07/12/20 132/86  02/05/20 114/76   Wt Readings from Last 3 Encounters:  07/12/20 249 lb (112.9 kg)  02/05/20 250 lb (113.4 kg)  01/14/20 258 lb (117 kg)      Physical Exam    Patient is alert and oriented and responsive to questions Engages in conversation with provider. Speaks in full sentences without any pauses without  any shortness of breath or distress.    Assessment & Plan     Viral upper respiratory tract infection  Pregnancy with 32 completed weeks gestation  History of wheezing  Mild intermittent asthma without complication  Discussed over the counter management of mild symptoms and her OBGYN medication safe list for pregnancy. May have tested to early for covid advise testing at parking lot or orther facility for Covid 19 as well on 3/4 or 01/15/21.  Red Flags discussed. The patient was given clear instructions to go to ER or return to medical center if any red flags develop, symptoms do not improve, worsen or new problems develop. They verbalized understanding.  Remain hydrated with diarrhea.   Red Flags discussed. The patient was given clear instructions to go to ER or return to medical center if any red flags develop, symptoms do not improve, worsen or new problems develop. They verbalized understanding. Seek OBGYN care if pregnancy symptoms/   Return in about 4 days (around 01/17/2021), or if symptoms worsen or fail to improve, for at any time for any worsening symptoms, Go to Emergency room/ urgent care if worse.     I discussed the assessment and treatment plan with the patient. The patient was provided an opportunity to ask questions and all were answered. The patient agreed with the plan and demonstrated an understanding of the instructions.   The patient was advised to call back or seek an in-person evaluation if the symptoms worsen or if the condition fails to improve as anticipated.  I provided 30 minutes of non-face-to-face time during this encounter.  The entirety of the information documented in the History of Present Illness, Review of Systems and Physical Exam were personally obtained by me. Portions of this information were initially documented by the CMA and reviewed by me for thoroughness and accuracy.    Marcille Buffy, [place] [phone]  (phone) [phone] (fax)  [place]

## 2021-03-14 ENCOUNTER — Ambulatory Visit: Payer: Self-pay

## 2021-03-14 NOTE — Telephone Encounter (Signed)
Patient called and says on Friday 03/10/21 while in the hospital, she was given MMR and Varicella vaccines, one in each arm. She says on Saturday 03/11/21 her right arm at the injection site started swelling and the arm is red on the backside of the arm where the injection was given all the way to her elbow. She says she's taking Ibuprofen for post partum, the arm is warm and tender, no pain, no fever, no other symptoms. She asked for an appointment. No available appointments with any provider. I advised I will send this to the office and someone will call with a providers advice. She says she's seen Dr. B in the office before. She's aware Anderson Malta is no longer at the practice. Care advice given, patient verbalized understanding.   Reason for Disposition . [1] Pain, tenderness, or swelling at the injection site AND [2] persists > 3 days  Answer Assessment - Initial Assessment Questions 1. SYMPTOMS: "What is the main symptom?" (e.g., redness, swelling, pain)      Redness down to elbow of the right arm, swelling 3x3.5" oval shaped 2. ONSET: "When was the vaccine (shot) given?" "How much later did the redness/swelling begin?" (e.g., hours, days ago)      Friday, 03/10/21 MMR/Varicella given, Saturday 03/11/21 the redness and swelling started in the right arm 3. SEVERITY: "How bad is it?"      Moderate 4. FEVER: "Is there a fever?" If Yes, ask: "What is it, how was it measured, and when did it start?"      No 5. IMMUNIZATIONS GIVEN: "What shots have you recently received?"     MMR/Varicella given one in each arm 6. PAST REACTIONS: "Have you reacted to immunizations before?" If Yes, ask: "What happened?"     No 7. OTHER SYMPTOMS: "Do you have any other symptoms?"     No  Protocols used: IMMUNIZATION REACTIONS-A-AH

## 2021-03-15 NOTE — Telephone Encounter (Signed)
Can see if anyone has an opening. Otherwise, offer [facility] virtual or UC.

## 2021-03-15 NOTE — Telephone Encounter (Signed)
Spoke with patient on the phone and advised her there were no openings this week in office it is recommened patient be evaluated at urgent care or through [facility] virtual, patient understood and states that redness and swelling have improved since she last spoke with nurse. KW

## 2021-03-16 ENCOUNTER — Ambulatory Visit: Payer: Self-pay | Admitting: Family Medicine

## 2021-05-06 ENCOUNTER — Telehealth: Payer: Self-pay | Admitting: Physician Assistant

## 2021-05-06 NOTE — Telephone Encounter (Signed)
Patient has already had 2 varicella vaccines. Does she need another one? She is listed as Margaret Grant in [place]. Please advise. Thanks!

## 2021-05-06 NOTE — Telephone Encounter (Signed)
Pt is calling to schedule her second Varicella vaccine please advise Cb- [phone]

## 2021-05-09 NOTE — Telephone Encounter (Signed)
Should be done after 2 shots. Here is the recommendation from the CDC: Give 2 doses 4 to 8 weeks apart If it has been more than 8 weeks since the first dose, the second dose may be given without restarting the schedule

## 2021-05-09 NOTE — Telephone Encounter (Signed)
LMTCB

## 2021-05-10 NOTE — Telephone Encounter (Signed)
Patient reports that during her pregnancy she found out that her varicella titer was negative. She reports that after having the baby, she got her 1st varicella vaccine (03/10/21) informed patient that she would need to go to the health dept as we do not have varicella vaccines in the office anymore. Patient verbally understands.

## 2021-05-23 ENCOUNTER — Other Ambulatory Visit: Payer: Self-pay

## 2021-05-23 ENCOUNTER — Ambulatory Visit (LOCAL_COMMUNITY_HEALTH_CENTER): Payer: BC Managed Care – PPO

## 2021-05-23 DIAGNOSIS — Z7185 Encounter for immunization safety counseling: Secondary | ICD-10-CM

## 2021-05-23 NOTE — Progress Notes (Signed)
In nurse clinic requesting varicella vaccine. Varicella titer negative (08/30/2020) while pregnant. Upon Epic review, pt has varicella vaccine documentation 09/17/2014 and 06/26/2014. Pt was also given Varicella vaccine 03/11/21 after delivery. Pt states her ob provider wanted her to have 2nd varicella vaccine. Pt reports no bc method and is currently sexually active with sex most recently 05/22/2021. Consult Vertell Novak, MD who advises that with pt hx of varicella vaccines, no additional varicella vaccines are indicated. Dr. Ernestina Patches explains that the varicella vaccines the pt had as a child are continuing to provide protection even though titer is low. Also, d/t possibility of preg today and because varicella vaccine is a live vaccine, it would not be given today. In order for a live vaccine to be administered, pt would have to abstain from sex for 2 weeks and take a preg test. Provider recommendations explained to pt. Pt in agreement and decides not to proceed with vaccine since it is not indicated. Pt states she is not interested in any bc resources today. Questions answered and reports understanding. Josie Saunders, RN

## 2021-05-24 NOTE — Progress Notes (Signed)
Curator for clinical support staff: I agree with the care provided to this patient and was available for consultation.  I was consulted at the point of care and documentation reflects my recommendations.   Caren Macadam, MD, MPH, ABFM [facility] Medical Director

## 2021-07-04 ENCOUNTER — Ambulatory Visit: Payer: BC Managed Care – PPO | Admitting: Family Medicine

## 2021-07-04 ENCOUNTER — Other Ambulatory Visit: Payer: Self-pay

## 2021-07-04 ENCOUNTER — Encounter: Payer: Self-pay | Admitting: Family Medicine

## 2021-07-04 VITALS — BP 115/87 | HR 67 | Temp 98.4°F | Resp 16 | Wt 255.2 lb

## 2021-07-04 DIAGNOSIS — R1011 Right upper quadrant pain: Secondary | ICD-10-CM | POA: Diagnosis not present

## 2021-07-04 NOTE — Progress Notes (Signed)
Established patient visit   Patient: Margaret Grant   DOB: 1996/03/28   25 y.o. Female  MRN: [id number] Visit Date: 07/04/2021  Today's healthcare provider: Gwyneth Sprout, FNP   Chief Complaint  Patient presents with   Abdominal Pain   Subjective  -------------------------------------------------------------------------------------------------------------------- Abdominal Pain This is a new problem. The current episode started 1 to 4 weeks ago. The onset quality is sudden. The problem occurs intermittently. The pain is located in the RUQ. The pain is at a severity of 10/10. The quality of the pain is dull and sharp. The abdominal pain radiates to the epigastric region. Associated symptoms include diarrhea, nausea, vomiting and weight loss. Pertinent negatives include no anorexia, arthralgias, belching, constipation, dysuria, fever, flatus, frequency, headaches, hematochezia, hematuria, melena or myalgias. Associated symptoms comments: Discolored urine ( darker in color). Nothing aggravates the pain. The pain is relieved by Nothing. Treatments tried: Ibuprofen. The treatment provided no relief. There is no history of abdominal surgery, colon cancer, Crohn's disease, gallstones, GERD, irritable bowel syndrome, pancreatitis, PUD or ulcerative colitis.        Medications: Outpatient Medications Prior to Visit  Medication Sig   albuterol (VENTOLIN HFA) 108 (90 Base) MCG/ACT inhaler Inhale 2 puffs into the lungs every 6 (six) hours as needed for wheezing or shortness of breath.   fluticasone (FLONASE) 50 MCG/ACT nasal spray Place 2 sprays into both nostrils daily.   No facility-administered medications prior to visit.    Review of Systems  Constitutional:  Positive for weight loss. Negative for fever.  Gastrointestinal:  Positive for abdominal pain, diarrhea, nausea and vomiting. Negative for anorexia, constipation, flatus, hematochezia and melena.  Genitourinary:  Negative  for dysuria, frequency and hematuria.  Musculoskeletal:  Negative for arthralgias and myalgias.  Neurological:  Negative for headaches.      Objective  -------------------------------------------------------------------------------------------------------------------- BP 115/87   Pulse 67   Temp 98.4 F (36.9 C) (Oral)   Resp 16   Wt 255 lb 3.2 oz (115.8 kg)   LMP 06/07/2021   SpO2 98%   BMI 41.19 kg/m     Physical Exam Vitals and nursing note reviewed.  Constitutional:      General: She is not in acute distress.    Appearance: She is well-developed. She is obese. She is not ill-appearing, toxic-appearing or diaphoretic.  HENT:     Head: Normocephalic.  Cardiovascular:     Rate and Rhythm: Normal rate and regular rhythm.     Heart sounds: Normal heart sounds. No murmur heard.   No friction rub. No gallop.  Pulmonary:     Effort: Pulmonary effort is normal.     Breath sounds: Normal breath sounds.  Abdominal:     General: Bowel sounds are normal. There is no distension.     Palpations: Abdomen is soft. There is no hepatomegaly, splenomegaly, mass or pulsatile mass.     Tenderness: There is no abdominal tenderness. There is no right CVA tenderness, left CVA tenderness, guarding or rebound. Negative signs include Murphy's sign, Rovsing's sign and McBurney's sign.     Hernia: No hernia is present.  Skin:    General: Skin is warm and dry.     Capillary Refill: Capillary refill takes less than 2 seconds.  Neurological:     Mental Status: She is alert.    Weight loss is intentional given recent pregnancy.   No results found for any visits on 07/04/21.  Assessment & Plan  ---------------------------------------------------------------------------------------------------------------------- Problem List Items Addressed  This Visit       Other   RUQ pain - Primary    Minor episodes; worst this past weekend following accidental fasting episode RUQ to Epigastric; slight  referred pain to R mid back Negative exam today Plan for lab work [place] if lab work indicates [place] for gallbladder concerns      Relevant Orders   Hepatic function panel   CBC with Differential/Platelet   Comprehensive metabolic panel     Return if symptoms worsen or fail to improve.      Vonna Kotyk, FNP, have reviewed all documentation for this visit. The documentation on 07/04/21 for the exam, diagnosis, procedures, and orders are all accurate and complete.    Gwyneth Sprout, [place] [phone] (phone) [phone] (fax)  [place]

## 2021-07-04 NOTE — Assessment & Plan Note (Signed)
Minor episodes; worst this past weekend following accidental fasting episode RUQ to Epigastric; slight referred pain to R mid back Negative exam today Plan for lab work [place] if lab work indicates [place] for gallbladder concerns

## 2021-07-05 ENCOUNTER — Other Ambulatory Visit: Payer: Self-pay | Admitting: Family Medicine

## 2021-07-05 ENCOUNTER — Ambulatory Visit
Admission: RE | Admit: 2021-07-05 | Discharge: 2021-07-05 | Disposition: A | Discharge status: Home / Self Care | Payer: BC Managed Care – PPO | Source: Ambulatory Visit | Attending: Family Medicine | Admitting: Family Medicine

## 2021-07-05 DIAGNOSIS — R1011 Right upper quadrant pain: Secondary | ICD-10-CM | POA: Diagnosis present

## 2021-07-05 LAB — CBC WITH DIFFERENTIAL/PLATELET
Basophils Absolute: 0 10*3/uL (ref 0.0–0.2)
Basos: 1 %
EOS (ABSOLUTE): 0.3 10*3/uL (ref 0.0–0.4)
Eos: 7 %
Hematocrit: 43.8 % (ref 34.0–46.6)
Hemoglobin: 14.9 g/dL (ref 11.1–15.9)
Immature Grans (Abs): 0 10*3/uL (ref 0.0–0.1)
Immature Granulocytes: 0 %
Lymphocytes Absolute: 1.3 10*3/uL (ref 0.7–3.1)
Lymphs: 37 %
MCH: 28.5 pg (ref 26.6–33.0)
MCHC: 34 g/dL (ref 31.5–35.7)
MCV: 84 fL (ref 79–97)
Monocytes Absolute: 0.4 10*3/uL (ref 0.1–0.9)
Monocytes: 11 %
Neutrophils Absolute: 1.5 10*3/uL (ref 1.4–7.0)
Neutrophils: 44 %
Platelets: 345 10*3/uL (ref 150–450)
RBC: 5.23 x10E6/uL (ref 3.77–5.28)
RDW: 12.7 % (ref 11.7–15.4)
WBC: 3.5 10*3/uL (ref 3.4–10.8)

## 2021-07-05 LAB — COMPREHENSIVE METABOLIC PANEL
ALT: 1457 IU/L (ref 0–32)
AST: 813 IU/L (ref 0–40)
Albumin/Globulin Ratio: 1.6 (ref 1.2–2.2)
Albumin: 4.4 g/dL (ref 3.9–5.0)
Alkaline Phosphatase: 104 IU/L (ref 44–121)
BUN/Creatinine Ratio: 6 — ABNORMAL LOW (ref 9–23)
BUN: 5 mg/dL — ABNORMAL LOW (ref 6–20)
Bilirubin Total: 1.4 mg/dL — ABNORMAL HIGH (ref 0.0–1.2)
CO2: 22 mmol/L (ref 20–29)
Calcium: 9.8 mg/dL (ref 8.7–10.2)
Chloride: 101 mmol/L (ref 96–106)
Creatinine, Ser: 0.85 mg/dL (ref 0.57–1.00)
Globulin, Total: 2.7 g/dL (ref 1.5–4.5)
Glucose: 87 mg/dL (ref 65–99)
Potassium: 4.8 mmol/L (ref 3.5–5.2)
Sodium: 140 mmol/L (ref 134–144)
Total Protein: 7.1 g/dL (ref 6.0–8.5)
eGFR: 98 mL/min/{1.73_m2} (ref >59)

## 2021-07-05 LAB — HEPATIC FUNCTION PANEL: Bilirubin, Direct: 1.03 mg/dL — ABNORMAL HIGH (ref 0.00–0.40)

## 2021-07-06 ENCOUNTER — Ambulatory Visit: Payer: BC Managed Care – PPO | Admitting: Family Medicine

## 2021-07-11 NOTE — Telephone Encounter (Signed)
Opened in error. KW °

## 2021-07-19 NOTE — Progress Notes (Signed)
Established patient visit   Patient: Margaret Grant   DOB: 1996-03-28   24 y.o. Female  MRN: [id number] Visit Date: 07/20/2021  Today's healthcare provider: Gwyneth Sprout, FNP   Worsening anxiety following birth of daughter; interfering with sleep patterns.  Subjective    HPI  Patient has concerns of anxiety. Interferes with her sleeping, has to lay for hours before falling asleep, so anxious or worried about something her stomach begins to hurt and she has to make bowel movements. High heart rate at times, overly worried and obsessive about something's at times. No other associated symptoms.     Medications: Outpatient Medications Prior to Visit  Medication Sig   albuterol (VENTOLIN HFA) 108 (90 Base) MCG/ACT inhaler Inhale 2 puffs into the lungs every 6 (six) hours as needed for wheezing or shortness of breath.   fluticasone (FLONASE) 50 MCG/ACT nasal spray Place 2 sprays into both nostrils daily.   No facility-administered medications prior to visit.    Review of Systems     Objective    BP 106/75 (BP Location: Right Arm, Patient Position: Sitting, Cuff Size: Large)   Pulse 89   Temp 98.2 F (36.8 C) (Oral)   Ht '5\' 6"'$  (1.676 m)   Wt 256 lb 1.6 oz (116.2 kg)   LMP 07/18/2021   SpO2 98%   BMI 41.34 kg/m  {Show previous vital signs (optional):23777}  Physical Exam Vitals and nursing note reviewed.  Constitutional:      General: She is not in acute distress.    Appearance: Normal appearance. She is obese. She is not ill-appearing, toxic-appearing or diaphoretic.  HENT:     Head: Normocephalic and atraumatic.  Cardiovascular:     Rate and Rhythm: Normal rate and regular rhythm.     Pulses: Normal pulses.     Heart sounds: Normal heart sounds. No murmur heard.   No friction rub. No gallop.  Pulmonary:     Effort: Pulmonary effort is normal. No respiratory distress.     Breath sounds: Normal breath sounds. No stridor. No wheezing, rhonchi or rales.   Chest:     Chest wall: No tenderness.  Abdominal:     General: Bowel sounds are normal.     Palpations: Abdomen is soft.  Musculoskeletal:        General: No swelling, tenderness, deformity or signs of injury. Normal range of motion.     Right lower leg: No edema.     Left lower leg: No edema.  Skin:    General: Skin is warm and dry.     Capillary Refill: Capillary refill takes less than 2 seconds.     Coloration: Skin is not jaundiced or pale.     Findings: No bruising, erythema, lesion or rash.  Neurological:     General: No focal deficit present.     Mental Status: She is alert and oriented to person, place, and time. Mental status is at baseline.     Cranial Nerves: No cranial nerve deficit.     Sensory: No sensory deficit.     Motor: No weakness.     Coordination: Coordination normal.  Psychiatric:        Mood and Affect: Mood normal.        Behavior: Behavior normal.        Thought Content: Thought content normal.        Judgment: Judgment normal.    No results found for any visits on 07/20/21.  Assessment & Plan     Problem List Items Addressed This Visit       Other   Anxiety and depression - Primary    Acute on chronic problem Never treated before Unable to stop train of thought to rest Reports of elevated HR when continuing to think about multiple things/items that need to be done Denies SI or HI Reviewed [place] postpartum questionnaire with patient Start Cymbalta- plan to increase dose after 1 week      Relevant Medications   DULoxetine (CYMBALTA) 30 MG capsule   traZODone (DESYREL) 50 MG tablet   Adjustment insomnia    Trial of trazodone for non working nights Recommend sleep hygiene to best of ability with 24 hr shifts      Relevant Medications   traZODone (DESYREL) 50 MG tablet     Return in about 4 weeks (around 08/17/2021) for anxiety and depression.      Vonna Kotyk, FNP, have reviewed all documentation for this visit. The  documentation on 07/20/21 for the exam, diagnosis, procedures, and orders are all accurate and complete.    Gwyneth Sprout, [place] [phone] (phone) [phone] (fax)  [place]

## 2021-07-20 ENCOUNTER — Ambulatory Visit: Payer: BC Managed Care – PPO | Admitting: Family Medicine

## 2021-07-20 ENCOUNTER — Other Ambulatory Visit: Payer: Self-pay

## 2021-07-20 ENCOUNTER — Encounter: Payer: Self-pay | Admitting: Family Medicine

## 2021-07-20 VITALS — BP 106/75 | HR 89 | Temp 98.2°F | Ht 66.0 in | Wt 256.1 lb

## 2021-07-20 DIAGNOSIS — F5102 Adjustment insomnia: Secondary | ICD-10-CM | POA: Diagnosis not present

## 2021-07-20 DIAGNOSIS — F32A Depression, unspecified: Secondary | ICD-10-CM | POA: Diagnosis not present

## 2021-07-20 DIAGNOSIS — F419 Anxiety disorder, unspecified: Secondary | ICD-10-CM | POA: Diagnosis not present

## 2021-07-20 MED ORDER — DULOXETINE HCL 30 MG PO CPEP
ORAL_CAPSULE | ORAL | 0 refills | Status: DC
Start: 2021-07-20 — End: 2021-08-22

## 2021-07-20 MED ORDER — TRAZODONE HCL 50 MG PO TABS: 25.0000 mg | ORAL_TABLET | Freq: Every evening | ORAL | 3 refills | Status: DC | PRN

## 2021-07-20 NOTE — Assessment & Plan Note (Addendum)
Acute on chronic problem Never treated before Unable to stop train of thought to rest Reports of elevated HR when continuing to think about multiple things/items that need to be done Denies SI or HI Reviewed [place] postpartum questionnaire with patient Start Cymbalta- plan to increase dose after 1 week

## 2021-07-20 NOTE — Assessment & Plan Note (Signed)
Trial of trazodone for non working nights Recommend sleep hygiene to best of ability with 24 hr shifts

## 2021-08-22 ENCOUNTER — Other Ambulatory Visit: Payer: Self-pay | Admitting: Family Medicine

## 2021-08-22 DIAGNOSIS — F32A Depression, unspecified: Secondary | ICD-10-CM

## 2021-08-24 MED ORDER — DULOXETINE HCL 30 MG PO CPEP: 60.0000 mg | ORAL_CAPSULE | Freq: Every day | ORAL | 0 refills | Status: DC

## 2021-08-24 NOTE — Telephone Encounter (Signed)
LOV: 07/20/2021 NOV: None  Last Refill 07/20/2021   Pt is due for a four week follow-up   Thanks,   -Mickel Baas

## 2021-09-05 ENCOUNTER — Other Ambulatory Visit: Payer: Self-pay

## 2021-09-05 ENCOUNTER — Ambulatory Visit: Payer: BC Managed Care – PPO | Admitting: Family Medicine

## 2021-09-05 ENCOUNTER — Encounter: Payer: Self-pay | Admitting: Family Medicine

## 2021-09-05 VITALS — BP 109/75 | HR 71 | Temp 98.2°F | Resp 16 | Wt 250.4 lb

## 2021-09-05 DIAGNOSIS — F419 Anxiety disorder, unspecified: Secondary | ICD-10-CM

## 2021-09-05 DIAGNOSIS — K8013 Calculus of gallbladder with acute and chronic cholecystitis with obstruction: Secondary | ICD-10-CM | POA: Insufficient documentation

## 2021-09-05 DIAGNOSIS — B009 Herpesviral infection, unspecified: Secondary | ICD-10-CM

## 2021-09-05 DIAGNOSIS — K219 Gastro-esophageal reflux disease without esophagitis: Secondary | ICD-10-CM | POA: Insufficient documentation

## 2021-09-05 DIAGNOSIS — D1803 Hemangioma of intra-abdominal structures: Secondary | ICD-10-CM | POA: Insufficient documentation

## 2021-09-05 DIAGNOSIS — F32A Depression, unspecified: Secondary | ICD-10-CM

## 2021-09-05 DIAGNOSIS — G479 Sleep disorder, unspecified: Secondary | ICD-10-CM | POA: Insufficient documentation

## 2021-09-05 MED ORDER — DULOXETINE HCL 30 MG PO CPEP: 60.0000 mg | ORAL_CAPSULE | Freq: Every day | ORAL | 3 refills | Status: DC

## 2021-09-05 MED ORDER — FAMOTIDINE 20 MG PO TABS: 20.0000 mg | ORAL_TABLET | Freq: Two times a day (BID) | ORAL | 0 refills | Status: DC

## 2021-09-05 MED ORDER — VALACYCLOVIR HCL 1 G PO TABS: 1000.0000 mg | ORAL_TABLET | Freq: Two times a day (BID) | ORAL | 5 refills | Status: AC | PRN

## 2021-09-05 NOTE — Progress Notes (Signed)
Established patient visit   Patient: Margaret Grant   DOB: 1996-03-17   25 y.o. Female  MRN: [id number] Visit Date: 09/05/2021  Today's healthcare provider: Gwyneth Sprout, FNP   Chief Complaint  Patient presents with   Anxiety   Depression   Subjective    HPI  Anxiety/Depression Follow-up She was last seen for anxiety 1  month ago. Changes made at last visit include start Cymbalta 30mg , d/c Trazodone   She reports excellent compliance with treatment. She reports excellent tolerance of treatment. She is not having side effects.   She feels her anxiety is mild and Improved since last visit.  Symptoms: No chest pain No difficulty concentrating  No dizziness No fatigue  No feelings of losing control Yes insomnia  No irritable No palpitations  No panic attacks No racing thoughts  No shortness of breath No sweating  No tremors/shakes    GAD-7 Results No flowsheet data found.  PHQ-9 Scores PHQ9 SCORE ONLY 07/20/2021 10/23/2019 03/14/2018  PHQ-9 Total Score 4 1 0    ---------------------------------------------------------------------------------------------------     Medications: Outpatient Medications Prior to Visit  Medication Sig   albuterol (VENTOLIN HFA) 108 (90 Base) MCG/ACT inhaler Inhale 2 puffs into the lungs every 6 (six) hours as needed for wheezing or shortness of breath.   fluticasone (FLONASE) 50 MCG/ACT nasal spray Place 2 sprays into both nostrils daily.   traZODone (DESYREL) 50 MG tablet Take 0.5-1 tablets (25-50 mg total) by mouth at bedtime as needed for sleep.   [DISCONTINUED] DULoxetine (CYMBALTA) 30 MG capsule Take 2 capsules (60 mg total) by mouth daily. Please schedule an office visit before anymore refills.   No facility-administered medications prior to visit.    Review of Systems     Objective    There were no vitals taken for this visit.   Physical Exam Vitals and nursing note reviewed.  Constitutional:       General: She is not in acute distress.    Appearance: Normal appearance. She is obese. She is not ill-appearing, toxic-appearing or diaphoretic.  HENT:     Head: Normocephalic and atraumatic.  Cardiovascular:     Rate and Rhythm: Normal rate and regular rhythm.     Pulses: Normal pulses.     Heart sounds: Normal heart sounds. No murmur heard.   No friction rub. No gallop.  Pulmonary:     Effort: Pulmonary effort is normal. No respiratory distress.     Breath sounds: Normal breath sounds. No stridor. No wheezing, rhonchi or rales.  Chest:     Chest wall: No tenderness.  Abdominal:     General: Bowel sounds are normal.     Palpations: Abdomen is soft.  Musculoskeletal:        General: No swelling, tenderness, deformity or signs of injury. Normal range of motion.     Right lower leg: No edema.     Left lower leg: No edema.  Skin:    General: Skin is warm and dry.     Capillary Refill: Capillary refill takes less than 2 seconds.     Coloration: Skin is not jaundiced or pale.     Findings: No bruising, erythema, lesion or rash.  Neurological:     General: No focal deficit present.     Mental Status: She is alert and oriented to person, place, and time. Mental status is at baseline.     Cranial Nerves: No cranial nerve deficit.     Sensory:  No sensory deficit.     Motor: No weakness.     Coordination: Coordination normal.  Psychiatric:        Mood and Affect: Mood normal.        Behavior: Behavior normal.        Thought Content: Thought content normal.        Judgment: Judgment normal.      No results found for any visits on 09/05/21.  Assessment & Plan     Problem List Items Addressed This Visit       Cardiovascular and Mediastinum   Liver hemangioma    Suspected vs fatty liver dz Plan for MRI in 12/2021       Relevant Orders   MR Abdomen W Wo Contrast     Digestive   Calculus of gallbladder with acute on chronic cholecystitis with obstruction    Continue dietary  changes, referral placed to surgery      Relevant Orders   Ambulatory referral to Gastroenterology   Gastroesophageal reflux disease without esophagitis    Ongoing abdominal complaints Trial of H2RB- 90 days      Relevant Medications   famotidine (PEPCID) 20 MG tablet     Other   Anxiety and depression - Primary    Stable on cymbalta Continue medication Recommend therapy if additional support is needed      Relevant Medications   DULoxetine (CYMBALTA) 30 MG capsule   HSV-1 infection    Current cold sore, antiviral tx with refills      Relevant Medications   valACYclovir (VALTREX) 1000 MG tablet   Sleep difficulties    Has not been using trazodone- OK'd to use, given continued report of sleep difficulties Reportedly awake to 1-2 am, despite 'normal day to day activities' 'using' all her energy Recommend sleep hygiene and getting OOB if awake >30-60 mins and doing something else until tired        Return in about 4 months (around 01/06/2022), or liver f/u.     Vonna Kotyk, FNP, have reviewed all documentation for this visit. The documentation on 09/05/21 for the exam, diagnosis, procedures, and orders are all accurate and complete.    Gwyneth Sprout, [place] [phone] (phone) [phone] (fax)  [place]

## 2021-09-05 NOTE — Assessment & Plan Note (Signed)
Stable on cymbalta Continue medication Recommend therapy if additional support is needed

## 2021-09-05 NOTE — Assessment & Plan Note (Signed)
Has not been using trazodone- OK'd to use, given continued report of sleep difficulties Reportedly awake to 1-2 am, despite 'normal day to day activities' 'using' all her energy Recommend sleep hygiene and getting OOB if awake >30-60 mins and doing something else until tired

## 2021-09-05 NOTE — Assessment & Plan Note (Signed)
Current cold sore, antiviral tx with refills

## 2021-09-05 NOTE — Assessment & Plan Note (Signed)
Ongoing abdominal complaints Trial of H2RB- 90 days

## 2021-09-05 NOTE — Assessment & Plan Note (Signed)
Suspected vs fatty liver dz Plan for MRI in 12/2021

## 2021-09-05 NOTE — Assessment & Plan Note (Signed)
Continue dietary changes, referral placed to surgery

## 2021-09-06 ENCOUNTER — Other Ambulatory Visit: Payer: Self-pay

## 2021-09-06 ENCOUNTER — Emergency Department
Admission: EM | Admit: 2021-09-06 | Discharge: 2021-09-06 | Disposition: A | Discharge status: Home / Self Care | Payer: BC Managed Care – PPO | Attending: Emergency Medicine | Admitting: Emergency Medicine

## 2021-09-06 DIAGNOSIS — R112 Nausea with vomiting, unspecified: Secondary | ICD-10-CM | POA: Insufficient documentation

## 2021-09-06 DIAGNOSIS — Z5321 Procedure and treatment not carried out due to patient leaving prior to being seen by health care provider: Secondary | ICD-10-CM | POA: Diagnosis not present

## 2021-09-06 DIAGNOSIS — R1011 Right upper quadrant pain: Secondary | ICD-10-CM | POA: Insufficient documentation

## 2021-09-06 LAB — CBC
HCT: 44.1 % (ref 36.0–46.0)
Hemoglobin: 16.1 g/dL — ABNORMAL HIGH (ref 12.0–15.0)
MCH: 30.6 pg (ref 26.0–34.0)
MCHC: 36.5 g/dL — ABNORMAL HIGH (ref 30.0–36.0)
MCV: 83.7 fL (ref 80.0–100.0)
Platelets: 424 10*3/uL — ABNORMAL HIGH (ref 150–400)
RBC: 5.27 MIL/uL — ABNORMAL HIGH (ref 3.87–5.11)
RDW: 12.2 % (ref 11.5–15.5)
WBC: 6.8 10*3/uL (ref 4.0–10.5)
nRBC: 0 % (ref 0.0–0.2)

## 2021-09-06 LAB — COMPREHENSIVE METABOLIC PANEL
ALT: 1069 U/L — ABNORMAL HIGH (ref 0–44)
AST: 1444 U/L — ABNORMAL HIGH (ref 15–41)
Albumin: 4.3 g/dL (ref 3.5–5.0)
Alkaline Phosphatase: 54 U/L (ref 38–126)
Anion gap: 8 (ref 5–15)
BUN: 13 mg/dL (ref 6–20)
CO2: 26 mmol/L (ref 22–32)
Calcium: 9.8 mg/dL (ref 8.9–10.3)
Chloride: 104 mmol/L (ref 98–111)
Creatinine, Ser: 0.75 mg/dL (ref 0.44–1.00)
GFR, Estimated: >60 mL/min (ref >60)
Glucose, Bld: 141 mg/dL — ABNORMAL HIGH (ref 70–99)
Potassium: 4.2 mmol/L (ref 3.5–5.1)
Sodium: 138 mmol/L (ref 135–145)
Total Bilirubin: 2.6 mg/dL — ABNORMAL HIGH (ref 0.3–1.2)
Total Protein: 7.9 g/dL (ref 6.5–8.1)

## 2021-09-06 LAB — LIPASE, BLOOD: Lipase: 41 U/L (ref 11–51)

## 2021-09-06 NOTE — ED Triage Notes (Signed)
Pt to ED for gallstone flare since last night. . Reports pain to RUQ. +nausea and vomiting. NAD noted

## 2021-09-07 NOTE — ED Notes (Signed)
Contacted pcp via secure chat re lwot and elevated lfts.  They will follow up from there.

## 2021-10-04 ENCOUNTER — Encounter: Payer: Self-pay | Admitting: Family Medicine

## 2021-10-14 ENCOUNTER — Other Ambulatory Visit: Payer: Self-pay

## 2021-10-14 ENCOUNTER — Encounter: Payer: Self-pay | Admitting: Family Medicine

## 2021-10-14 ENCOUNTER — Ambulatory Visit: Payer: BC Managed Care – PPO | Admitting: Family Medicine

## 2021-10-14 VITALS — BP 112/67 | HR 81 | Resp 16 | Wt 248.0 lb

## 2021-10-14 DIAGNOSIS — R768 Other specified abnormal immunological findings in serum: Secondary | ICD-10-CM | POA: Insufficient documentation

## 2021-10-14 DIAGNOSIS — K8013 Calculus of gallbladder with acute and chronic cholecystitis with obstruction: Secondary | ICD-10-CM

## 2021-10-14 DIAGNOSIS — Z09 Encounter for follow-up examination after completed treatment for conditions other than malignant neoplasm: Secondary | ICD-10-CM | POA: Insufficient documentation

## 2021-10-14 NOTE — Assessment & Plan Note (Signed)
Ongoing gallbladder complaints since 06/2021- needs surgical removal

## 2021-10-14 NOTE — Progress Notes (Signed)
I,Margaret Grant,acting as a scribe for Gwyneth Sprout, FNP.,have documented all relevant documentation on the behalf of Gwyneth Sprout, FNP,as directed by  Gwyneth Sprout, FNP while in the presence of Gwyneth Sprout, FNP.  Established patient visit   Patient: Margaret Grant   DOB: 1996-10-26   25 y.o. Female  MRN: [id number] Visit Date: 10/14/2021  Today's healthcare provider: Gwyneth Sprout, FNP   Chief Complaint  Patient presents with   Hospitalization Follow-up   Subjective    HPI  Follow up Hospitalization  Patient was admitted to [facility] on 10/02/2021 and discharged on 10/03/2021. She was treated for; cholecystitis. Treatment for this included; see notes in chart. Telephone follow up was done on yes She reports good compliance with treatment. She reports this condition is improved.  ----------------------------------------------------------------------------------------- -  Patient states she feeling much improved since hospital discharge. She wants to discuss her test results with provider.    Medications: Outpatient Medications Prior to Visit  Medication Sig   albuterol (VENTOLIN HFA) 108 (90 Base) MCG/ACT inhaler Inhale 2 puffs into the lungs every 6 (six) hours as needed for wheezing or shortness of breath.   DULoxetine (CYMBALTA) 30 MG capsule Take 2 capsules (60 mg total) by mouth daily. Please schedule an office visit before anymore refills.   famotidine (PEPCID) 20 MG tablet Take 1 tablet (20 mg total) by mouth 2 (two) times daily.   fluticasone (FLONASE) 50 MCG/ACT nasal spray Place 2 sprays into both nostrils daily.   traZODone (DESYREL) 50 MG tablet Take 0.5-1 tablets (25-50 mg total) by mouth at bedtime as needed for sleep.   valACYclovir (VALTREX) 1000 MG tablet Take 1 tablet (1,000 mg total) by mouth 2 (two) times daily as needed.   No facility-administered medications prior to visit.    Review of Systems  Constitutional:  Negative for appetite  change, chills, fatigue and fever.  Respiratory:  Negative for chest tightness and shortness of breath.   Cardiovascular:  Negative for chest pain and palpitations.  Gastrointestinal:  Negative for abdominal pain, nausea and vomiting.  Neurological:  Negative for dizziness and weakness.      Objective    BP 112/67 (BP Location: Right Arm, Patient Position: Sitting, Cuff Size: Large)   Pulse 81   Resp 16   Wt 248 lb (112.5 kg)   SpO2 98%   BMI 40.03 kg/m  {Show previous vital signs (optional):23777}  Physical Exam Constitutional:      Appearance: Normal appearance. She is obese.  Cardiovascular:     Rate and Rhythm: Normal rate.     Pulses: Normal pulses.  Pulmonary:     Effort: Pulmonary effort is normal.  Musculoskeletal:        General: Normal range of motion.  Skin:    General: Skin is warm and dry.  Neurological:     General: No focal deficit present.     Mental Status: She is alert.  Psychiatric:        Mood and Affect: Mood normal.        Behavior: Behavior normal.        Thought Content: Thought content normal.        Judgment: Judgment normal.     No results found for any visits on 10/14/21.  Assessment & Plan     Problem List Items Addressed This Visit       Digestive   Calculus of gallbladder with acute on chronic cholecystitis with obstruction  Surgically referral placed; pt left AMA from [facility] as husband had kidney stone and they did not have additional childcare      Relevant Orders   Ambulatory referral to [facility] discharge follow-up - Primary    Ongoing gallbladder complaints since 06/2021- needs surgical removal       Relevant Orders   Ambulatory referral to General Surgery   Positive ANA (antinuclear antibody)    Advised of potential causes; and that repeat testing would not provide additional insight at this time given the inflammation is likely still present surrounding her gallbladder/liver         Return if symptoms worsen or fail to improve.     Vonna Kotyk, FNP, have reviewed all documentation for this visit. The documentation on 10/14/21 for the exam, diagnosis, procedures, and orders are all accurate and complete.    Gwyneth Sprout, [place] [phone] (phone) [phone] (fax)  [place]

## 2021-10-14 NOTE — Assessment & Plan Note (Signed)
Advised of potential causes; and that repeat testing would not provide additional insight at this time given the inflammation is likely still present surrounding her gallbladder/liver

## 2021-10-14 NOTE — Assessment & Plan Note (Signed)
Surgically referral placed; pt left AMA from [facility] as husband had kidney stone and they did not have additional childcare

## 2021-10-19 ENCOUNTER — Ambulatory Visit: Payer: BC Managed Care – PPO | Admitting: Surgery

## 2021-10-28 ENCOUNTER — Encounter: Payer: Self-pay | Admitting: Surgery

## 2021-10-28 ENCOUNTER — Other Ambulatory Visit: Payer: Self-pay

## 2021-10-28 ENCOUNTER — Ambulatory Visit: Payer: BC Managed Care – PPO | Admitting: Surgery

## 2021-10-28 VITALS — BP 116/83 | HR 74 | Temp 98.3°F | Ht 66.0 in | Wt 248.4 lb

## 2021-10-28 DIAGNOSIS — K8020 Calculus of gallbladder without cholecystitis without obstruction: Secondary | ICD-10-CM | POA: Diagnosis not present

## 2021-10-28 NOTE — H&P (View-Only) (Signed)
10/28/2021  Reason for Visit:  Symptomatic cholelithiasis  Requesting Provider:  Tally Joe, FNP  History of Present Illness: Margaret Grant is a 25 y.o. female presenting for evaluation of  symptomatic cholelithiasis.  The patient reports that she's recently had episodes of RUQ abdominal pain associated with nausea and one episode of emesis.  She saw her PCP for this initially on 07/07/21 and her workup included labs which showed total bili of 1.4, AST 813, ALT 1457, alk phos 104.  She had an ultrasound with showed cholelithiasis without evidence of cholecystitis with fatty liver changes.  Referral was made to GI and MRI was ordered as well.  She has presented to two different hospitals for the same pain episodes.  She had a visit to [facility] ED on 10/25 after having pain for about 12 hours, but by the time she was seen, her pain had resolved.  At that point, her laboratory workup did show elevated LFTs with total bili of 2.6, AST 1444, ALT 1069, Alk Phos 54, lipase 41.  The patient left without being seen so no further workup was done.  She then presented to [facility] on 10/01/21 and transferred to [facility] with another episode of abdominal pain.  Her workup included labs which showed total bili of 2.1, AST > 1500, ALT > 750, alk phos of 77, lipase 139.  U/S revealed cholelithiasis with borderline gallbladder wall thickening but no pericholecystic fluid.  She had an MRCP which showed the same but without any evidence of choledocholithiasis.  She had hepatology workup which was negative, and she was initially scheduled for cholecystectomy but she had to leave the hospital as her husband was having a procedure.  Today, the patient reports that she has not had any further episodes of abdominal pain since her visit to [facility], but she's interested in surgical management.  Past Medical History: Past Medical History:  Diagnosis Date   Allergy    Anxiety    Asthma    Labial cyst    Panic attacks     Pilonidal cyst      Past Surgical History: Past Surgical History:  Procedure Laterality Date   KNEE ARTHROSCOPY W/ MENISCAL REPAIR Right 2012   WISDOM TOOTH EXTRACTION  2017    Home Medications: Prior to Admission medications   Medication Sig Start Date End Date Taking? Authorizing Provider  albuterol (VENTOLIN HFA) 108 (90 Base) MCG/ACT inhaler Inhale 2 puffs into the lungs every 6 (six) hours as needed for wheezing or shortness of breath. 10/23/19  Yes Mar Daring, PA-C  DULoxetine (CYMBALTA) 30 MG capsule Take 2 capsules (60 mg total) by mouth daily. Please schedule an office visit before anymore refills. 09/05/21 08/31/22 Yes Gwyneth Sprout, FNP  fluticasone (FLONASE) 50 MCG/ACT nasal spray Place 2 sprays into both nostrils daily. 10/23/19  Yes Mar Daring, PA-C  traZODone (DESYREL) 50 MG tablet Take 0.5-1 tablets (25-50 mg total) by mouth at bedtime as needed for sleep. 07/20/21  Yes Gwyneth Sprout, FNP  valACYclovir (VALTREX) 1000 MG tablet Take 1 tablet (1,000 mg total) by mouth 2 (two) times daily as needed. 09/05/21 11/04/21 Yes Gwyneth Sprout, FNP    Allergies: No Known Allergies  Social History:  reports that she has quit smoking. Her smoking use included cigarettes. She has quit using smokeless tobacco. She reports current alcohol use. She reports that she does not use drugs.   Family History: Family History  Problem Relation Age of Onset  Hypertension Mother    Anxiety disorder Mother    Depression Mother    Drug abuse Father    Cancer Maternal Aunt        Brain   Cancer Maternal Uncle        Lung   Stroke Maternal Grandmother    COPD Maternal Grandmother    Hypotension Maternal Grandmother    Cancer Maternal Grandfather        Lung   Hypertension Maternal Grandfather    Anxiety disorder Cousin    Depression Cousin    Gout Cousin     Review of Systems: Review of Systems  Constitutional:  Negative for chills and fever.  HENT:  Negative  for hearing loss.   Respiratory:  Negative for shortness of breath.   Cardiovascular:  Negative for chest pain.  Gastrointestinal:  Positive for abdominal pain, nausea and vomiting. Negative for constipation and diarrhea.  Genitourinary:  Negative for dysuria.  Musculoskeletal:  Negative for myalgias.  Skin:  Negative for rash.  Neurological:  Negative for dizziness.  Psychiatric/Behavioral:  Negative for depression.    Physical Exam BP 116/83    Pulse 74    Temp 98.3 F (36.8 C)    Ht 5' 6"  (1.676 m)    Wt 248 lb 6.4 oz (112.7 kg)    LMP 09/18/2021 (Approximate)    SpO2 97%    BMI 40.09 kg/m  CONSTITUTIONAL: No acute distress HEENT:  Normocephalic, atraumatic, extraocular motion intact. NECK: Trachea is midline, and there is no jugular venous distension.  RESPIRATORY:  Lungs are clear, and breath sounds are equal bilaterally. Normal respiratory effort without pathologic use of accessory muscles. CARDIOVASCULAR: Heart is regular without murmurs, gallops, or rubs. GI: The abdomen is soft, non-distended, currently non-tender to palpation.  Negative Murphy's sign.  MUSCULOSKELETAL:  Normal muscle strength and tone in all four extremities.  No peripheral edema or cyanosis. SKIN: Skin turgor is normal. There are no pathologic skin lesions.  NEUROLOGIC:  Motor and sensation is grossly normal.  Cranial nerves are grossly intact. PSYCH:  Alert and oriented to person, place and time. Affect is normal.  Laboratory Analysis: As above on HPI.  Imaging: As above on HPI.  I have personally viewed images available and agree with findings.  Assessment and Plan: This is a 25 y.o. female with symptomatic cholelithiasis.    --Discussed with the patient that her laboratory changes could be reflection of episodes of choledocholithiasis, with her passing the stones.  She does have some changes of fatty liver infiltration which could also contribute to elevated AST/ALT.  However, these are quite elevated  for just fatty liver infiltration.  Hepatology workup was negative.  I think give her symptoms, it is reasonable to proceed with surgical management as had been initially planned at [facility].  Discussed with her the role of robotic assisted cholecystectomy.  Reviewed the surgery at length with her including the risks of bleeding, infection, injury to surrounding structures, that this is an outpatient procedure, post-op activity restrictions, and she's willing to proceed. --Will repeat CMP to check on her LFTs. --Will schedule her for 11/23/21.  I spent 80 minutes dedicated to the care of this patient on the date of this encounter to include pre-visit review of records, face-to-face time with the patient discussing diagnosis and management, and any post-visit coordination of care.   Melvyn Neth, [facility]

## 2021-10-28 NOTE — Progress Notes (Signed)
10/28/2021  Reason for Visit:  Symptomatic cholelithiasis  Requesting Provider:  Tally Joe, FNP  History of Present Illness: Margaret Grant is a 25 y.o. female presenting for evaluation of  symptomatic cholelithiasis.  The patient reports that she's recently had episodes of RUQ abdominal pain associated with nausea and one episode of emesis.  She saw her PCP for this initially on 07/07/21 and her workup included labs which showed total bili of 1.4, AST 813, ALT 1457, alk phos 104.  She had an ultrasound with showed cholelithiasis without evidence of cholecystitis with fatty liver changes.  Referral was made to GI and MRI was ordered as well.  She has presented to two different hospitals for the same pain episodes.  She had a visit to [facility] ED on 10/25 after having pain for about 12 hours, but by the time she was seen, her pain had resolved.  At that point, her laboratory workup did show elevated LFTs with total bili of 2.6, AST 1444, ALT 1069, Alk Phos 54, lipase 41.  The patient left without being seen so no further workup was done.  She then presented to [facility] on 10/01/21 and transferred to [facility] with another episode of abdominal pain.  Her workup included labs which showed total bili of 2.1, AST > 1500, ALT > 750, alk phos of 77, lipase 139.  U/S revealed cholelithiasis with borderline gallbladder wall thickening but no pericholecystic fluid.  She had an MRCP which showed the same but without any evidence of choledocholithiasis.  She had hepatology workup which was negative, and she was initially scheduled for cholecystectomy but she had to leave the hospital as her husband was having a procedure.  Today, the patient reports that she has not had any further episodes of abdominal pain since her visit to [facility], but she's interested in surgical management.  Past Medical History: Past Medical History:  Diagnosis Date   Allergy    Anxiety    Asthma    Labial cyst    Panic attacks     Pilonidal cyst      Past Surgical History: Past Surgical History:  Procedure Laterality Date   KNEE ARTHROSCOPY W/ MENISCAL REPAIR Right 2012   WISDOM TOOTH EXTRACTION  2017    Home Medications: Prior to Admission medications   Medication Sig Start Date End Date Taking? Authorizing Provider  albuterol (VENTOLIN HFA) 108 (90 Base) MCG/ACT inhaler Inhale 2 puffs into the lungs every 6 (six) hours as needed for wheezing or shortness of breath. 10/23/19  Yes Mar Daring, PA-C  DULoxetine (CYMBALTA) 30 MG capsule Take 2 capsules (60 mg total) by mouth daily. Please schedule an office visit before anymore refills. 09/05/21 08/31/22 Yes Gwyneth Sprout, FNP  fluticasone (FLONASE) 50 MCG/ACT nasal spray Place 2 sprays into both nostrils daily. 10/23/19  Yes Mar Daring, PA-C  traZODone (DESYREL) 50 MG tablet Take 0.5-1 tablets (25-50 mg total) by mouth at bedtime as needed for sleep. 07/20/21  Yes Gwyneth Sprout, FNP  valACYclovir (VALTREX) 1000 MG tablet Take 1 tablet (1,000 mg total) by mouth 2 (two) times daily as needed. 09/05/21 11/04/21 Yes Gwyneth Sprout, FNP    Allergies: No Known Allergies  Social History:  reports that she has quit smoking. Her smoking use included cigarettes. She has quit using smokeless tobacco. She reports current alcohol use. She reports that she does not use drugs.   Family History: Family History  Problem Relation Age of Onset  Hypertension Mother    Anxiety disorder Mother    Depression Mother    Drug abuse Father    Cancer Maternal Aunt        Brain   Cancer Maternal Uncle        Lung   Stroke Maternal Grandmother    COPD Maternal Grandmother    Hypotension Maternal Grandmother    Cancer Maternal Grandfather        Lung   Hypertension Maternal Grandfather    Anxiety disorder Cousin    Depression Cousin    Gout Cousin     Review of Systems: Review of Systems  Constitutional:  Negative for chills and fever.  HENT:  Negative  for hearing loss.   Respiratory:  Negative for shortness of breath.   Cardiovascular:  Negative for chest pain.  Gastrointestinal:  Positive for abdominal pain, nausea and vomiting. Negative for constipation and diarrhea.  Genitourinary:  Negative for dysuria.  Musculoskeletal:  Negative for myalgias.  Skin:  Negative for rash.  Neurological:  Negative for dizziness.  Psychiatric/Behavioral:  Negative for depression.    Physical Exam BP 116/83    Pulse 74    Temp 98.3 F (36.8 C)    Ht 5' 6"  (1.676 m)    Wt 248 lb 6.4 oz (112.7 kg)    LMP 09/18/2021 (Approximate)    SpO2 97%    BMI 40.09 kg/m  CONSTITUTIONAL: No acute distress HEENT:  Normocephalic, atraumatic, extraocular motion intact. NECK: Trachea is midline, and there is no jugular venous distension.  RESPIRATORY:  Lungs are clear, and breath sounds are equal bilaterally. Normal respiratory effort without pathologic use of accessory muscles. CARDIOVASCULAR: Heart is regular without murmurs, gallops, or rubs. GI: The abdomen is soft, non-distended, currently non-tender to palpation.  Negative Murphy's sign.  MUSCULOSKELETAL:  Normal muscle strength and tone in all four extremities.  No peripheral edema or cyanosis. SKIN: Skin turgor is normal. There are no pathologic skin lesions.  NEUROLOGIC:  Motor and sensation is grossly normal.  Cranial nerves are grossly intact. PSYCH:  Alert and oriented to person, place and time. Affect is normal.  Laboratory Analysis: As above on HPI.  Imaging: As above on HPI.  I have personally viewed images available and agree with findings.  Assessment and Plan: This is a 25 y.o. female with symptomatic cholelithiasis.    --Discussed with the patient that her laboratory changes could be reflection of episodes of choledocholithiasis, with her passing the stones.  She does have some changes of fatty liver infiltration which could also contribute to elevated AST/ALT.  However, these are quite elevated  for just fatty liver infiltration.  Hepatology workup was negative.  I think give her symptoms, it is reasonable to proceed with surgical management as had been initially planned at [facility].  Discussed with her the role of robotic assisted cholecystectomy.  Reviewed the surgery at length with her including the risks of bleeding, infection, injury to surrounding structures, that this is an outpatient procedure, post-op activity restrictions, and she's willing to proceed. --Will repeat CMP to check on her LFTs. --Will schedule her for 11/23/21.  I spent 80 minutes dedicated to the care of this patient on the date of this encounter to include pre-visit review of records, face-to-face time with the patient discussing diagnosis and management, and any post-visit coordination of care.   Melvyn Neth, [facility]

## 2021-10-28 NOTE — Patient Instructions (Signed)
You have requested to have your gallbladder removed. This will be done at [facility] with Dr. Hampton Abbot.  You will most likely be out of work 1-2 weeks for this surgery. You will return after your post-op appointment with a lifting restriction for approximately 4 more weeks.  You will be able to eat anything you would like to following surgery. But, start by eating a bland diet and advance this as tolerated. The Gallbladder diet is below, please go as closely by this diet as possible prior to surgery to avoid any further attacks.  Please see the (blue)pre-care form that you have been given today. Our surgery scheduler will call you to look at surgery dates and to go over information.   If you have any questions, please call our office.  Laparoscopic Cholecystectomy Laparoscopic cholecystectomy is surgery to remove the gallbladder. The gallbladder is located in the upper right part of the abdomen, behind the liver. It is a storage sac for bile, which is produced in the liver. Bile aids in the digestion and absorption of fats. Cholecystectomy is often done for inflammation of the gallbladder (cholecystitis). This condition is usually caused by a buildup of gallstones (cholelithiasis) in the gallbladder. Gallstones can block the flow of bile, and that can result in inflammation and pain. In severe cases, emergency surgery may be required. If emergency surgery is not required, you will have time to prepare for the procedure. Laparoscopic surgery is an alternative to open surgery. Laparoscopic surgery has a shorter recovery time. Your common bile duct may also need to be examined during the procedure. If stones are found in the common bile duct, they may be removed. LET [facility] CARE PROVIDER KNOW ABOUT: Any allergies you have. All medicines you are taking, including vitamins, herbs, eye drops, creams, and over-the-counter medicines. Previous problems you or members of your family have had with  the use of anesthetics. Any blood disorders you have. Previous surgeries you have had.  Any medical conditions you have. RISKS AND COMPLICATIONS Generally, this is a safe procedure. However, problems may occur, including: Infection. Bleeding. Allergic reactions to medicines. Damage to other structures or organs. A stone remaining in the common bile duct. A bile leak from the cyst duct that is clipped when your gallbladder is removed. The need to convert to open surgery, which requires a larger incision in the abdomen. This may be necessary if your surgeon thinks that it is not safe to continue with a laparoscopic procedure. BEFORE THE PROCEDURE Ask your health care provider about: Changing or stopping your regular medicines. This is especially important if you are taking diabetes medicines or blood thinners. Taking medicines such as aspirin and ibuprofen. These medicines can thin your blood. Do not take these medicines before your procedure if your health care provider instructs you not to. Follow instructions from your health care provider about eating or drinking restrictions. Let your health care provider know if you develop a cold or an infection before surgery. Plan to have someone take you home after the procedure. Ask your health care provider how your surgical site will be marked or identified. You may be given antibiotic medicine to help prevent infection. PROCEDURE To reduce your risk of infection: Your health care team will wash or sanitize their hands. Your skin will be washed with soap. An IV tube may be inserted into one of your veins. You will be given a medicine to make you fall asleep (general anesthetic). A breathing tube will be placed  in your mouth. The surgeon will make several small cuts (incisions) in your abdomen. A thin, lighted tube (laparoscope) that has a tiny camera on the end will be inserted through one of the small incisions. The camera on the  laparoscope will send a picture to a TV screen (monitor) in the operating room. This will give the surgeon a good view inside your abdomen. A gas will be pumped into your abdomen. This will expand your abdomen to give the surgeon more room to perform the surgery. Other tools that are needed for the procedure will be inserted through the other incisions. The gallbladder will be removed through one of the incisions. After your gallbladder has been removed, the incisions will be closed with stitches (sutures), staples, or skin glue. Your incisions may be covered with a bandage (dressing). The procedure may vary among health care providers and hospitals. AFTER THE PROCEDURE Your blood pressure, heart rate, breathing rate, and blood oxygen level will be monitored often until the medicines you were given have worn off. You will be given medicines as needed to control your pain.   This information is not intended to replace advice given to you by your health care provider. Make sure you discuss any questions you have with your health care provider.   Document Released: 10/30/2005 Document Revised: 07/21/2015 Document Reviewed: 06/11/2013 Elsevier Interactive Patient Education 2016 [place] Diet for Gallbladder Conditions A low-fat diet can be helpful if you have pancreatitis or a gallbladder condition. With these conditions, your pancreas and gallbladder have trouble digesting fats. A healthy eating plan with less fat will help rest your pancreas and gallbladder and reduce your symptoms. WHAT DO I NEED TO KNOW ABOUT THIS DIET? Eat a low-fat diet. Reduce your fat intake to less than 20-30% of your total daily calories. This is less than 50-60 g of fat per day. Remember that you need some fat in your diet. Ask your dietician what your daily goal should be. Choose nonfat and low-fat healthy foods. Look for the words "nonfat," "low fat," or "fat free." As a guide, look on the label and  choose foods with less than 3 g of fat per serving. Eat only one serving. Avoid alcohol. Do not smoke. If you need help quitting, talk with your health care provider. Eat small frequent meals instead of three large heavy meals. WHAT FOODS CAN I EAT? Grains Include healthy grains and starches such as potatoes, wheat bread, fiber-rich cereal, and brown rice. Choose whole grain options whenever possible. In adults, whole grains should account for 45-65% of your daily calories.  Fruits and Vegetables Eat plenty of fruits and vegetables. Fresh fruits and vegetables add fiber to your diet. Meats and Other Protein Sources Eat lean meat such as chicken and pork. Trim any fat off of meat before cooking it. Eggs, fish, and beans are other sources of protein. In adults, these foods should account for 10-35% of your daily calories. Dairy Choose low-fat milk and dairy options. Dairy includes fat and protein, as well as calcium.  Fats and Oils Limit high-fat foods such as fried foods, sweets, baked goods, sugary drinks.  Other Creamy sauces and condiments, such as mayonnaise, can add extra fat. Think about whether or not you need to use them, or use smaller amounts or low fat options. WHAT FOODS ARE NOT RECOMMENDED? High fat foods, such as: [employer]. Ice cream. [place] toast. Sweet rolls. Pizza. Cheese bread. Foods covered with batter, butter, creamy sauces,  or cheese. Fried foods. Sugary drinks and desserts. Foods that cause gas or bloating   This information is not intended to replace advice given to you by your health care provider. Make sure you discuss any questions you have with your health care provider.   Document Released: 11/04/2013 Document Reviewed: 11/04/2013 Elsevier Interactive Patient Education [employer].

## 2021-10-31 ENCOUNTER — Telehealth: Payer: Self-pay | Admitting: Surgery

## 2021-10-31 NOTE — Telephone Encounter (Signed)
Outgoing call is made, left message for patient to call.  Please inform of the following:   Pre-Admission date/time, COVID Testing date and Surgery date.  Surgery Date: 11/23/21 Preadmission Testing Date: 11/16/21 (phone 8a-1p) Covid Testing Date: Not needed.     Also patient will need to call at [phone], between 1-3:00pm the day before surgery, to find out what time to arrive for surgery.

## 2021-11-01 NOTE — Telephone Encounter (Signed)
Outgoing call is made again, spoke with patient, she is now informed of all dates regarding her surgery and verbalized understanding.

## 2021-11-03 ENCOUNTER — Ambulatory Visit: Payer: BC Managed Care – PPO | Admitting: Gastroenterology

## 2021-11-03 ENCOUNTER — Ambulatory Visit: Payer: BC Managed Care – PPO | Admitting: Family Medicine

## 2021-11-12 IMAGING — US US ABDOMEN LIMITED
1 series · 13 of 25 positions shown · non-contrast
Comparison: None

CLINICAL DATA: Elevated LFTs, intermittent RIGHT upper quadrant
pain for 2 weeks

EXAM:
ULTRASOUND ABDOMEN LIMITED RIGHT UPPER QUADRANT

[Series 1: us abdomen limited · 0.28mm/px · 13 of 54 slices shown]
[im 1/54]
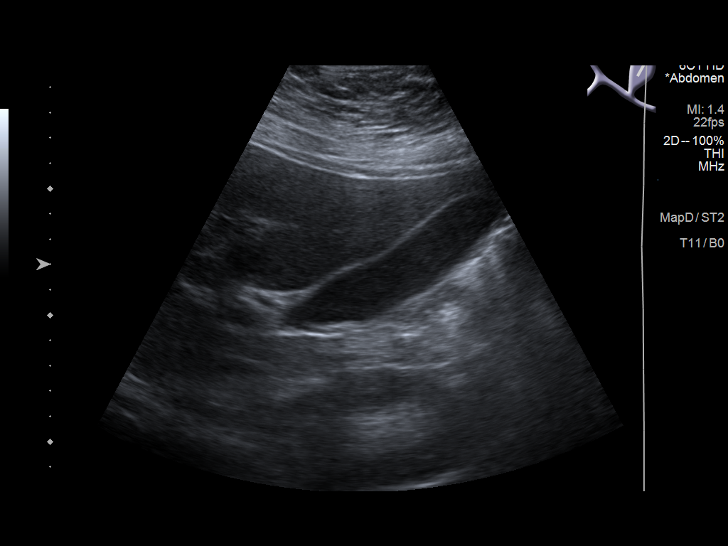
[im 5/54]
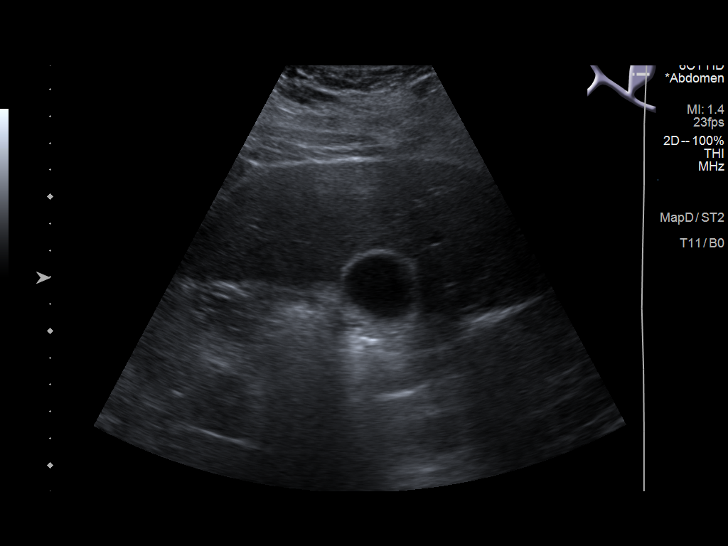
[im 9/54]
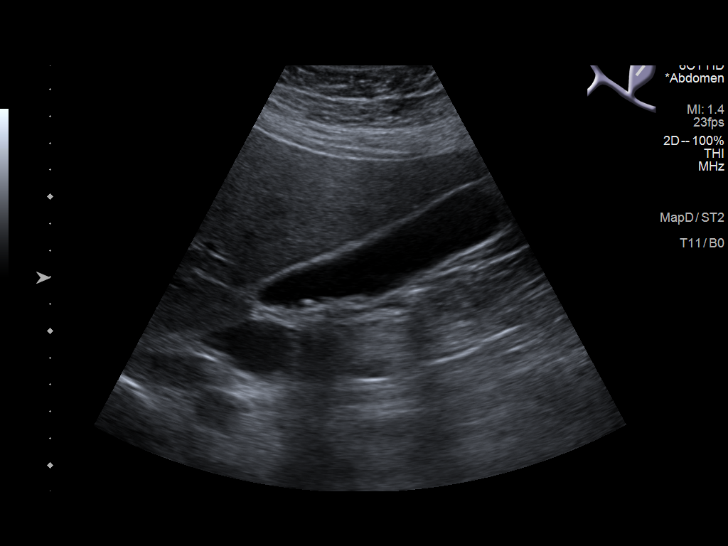
[im 14/54]
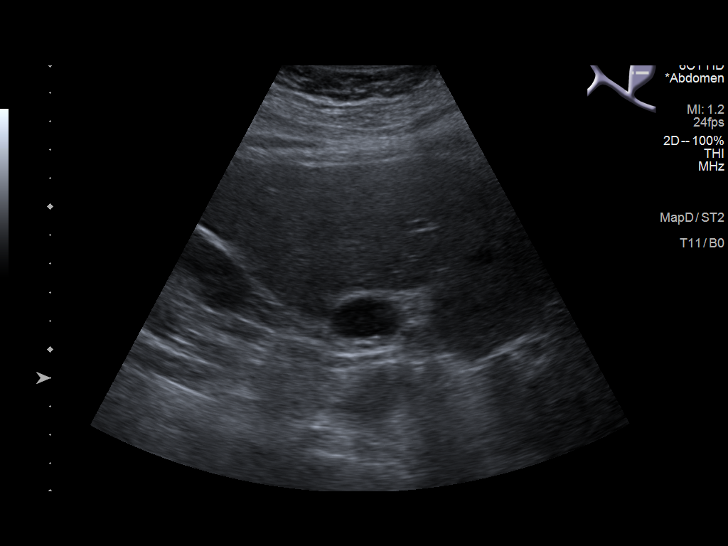
[im 18/54]
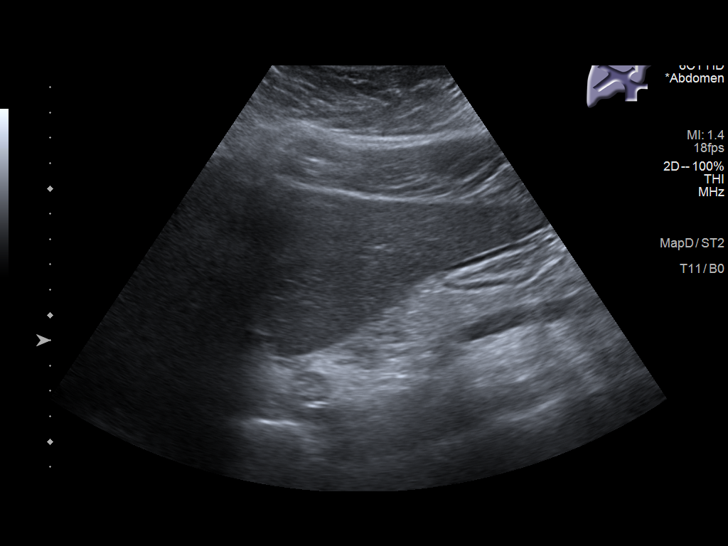
[im 23/54]
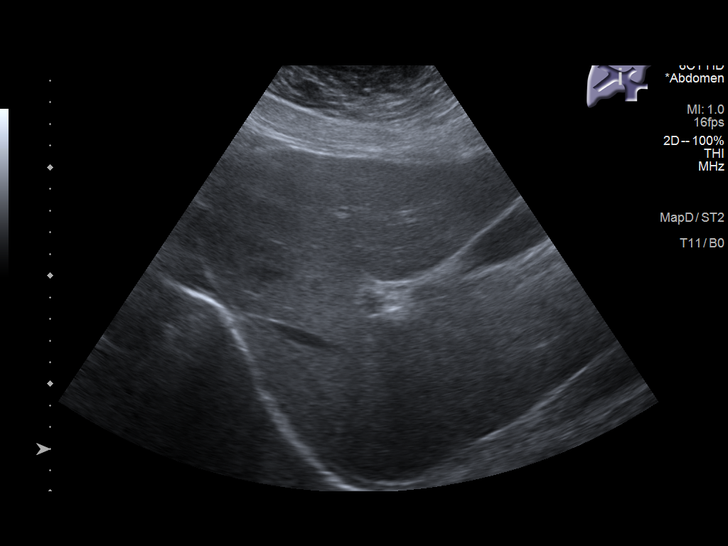
[im 27/54]
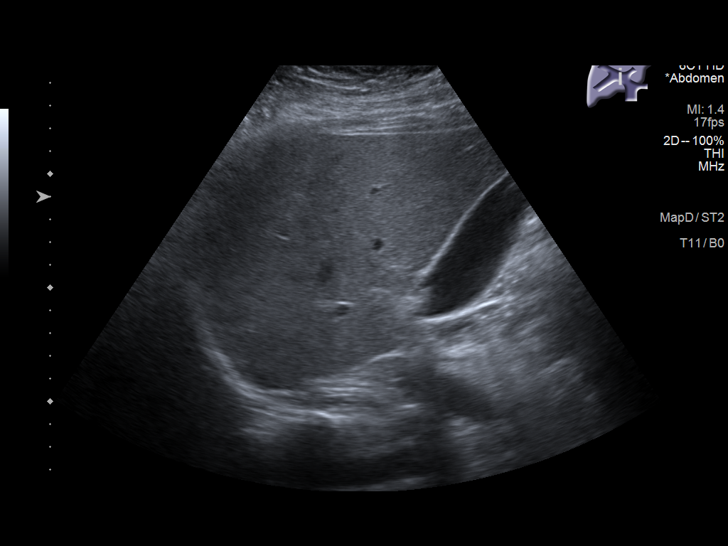
[im 31/54]
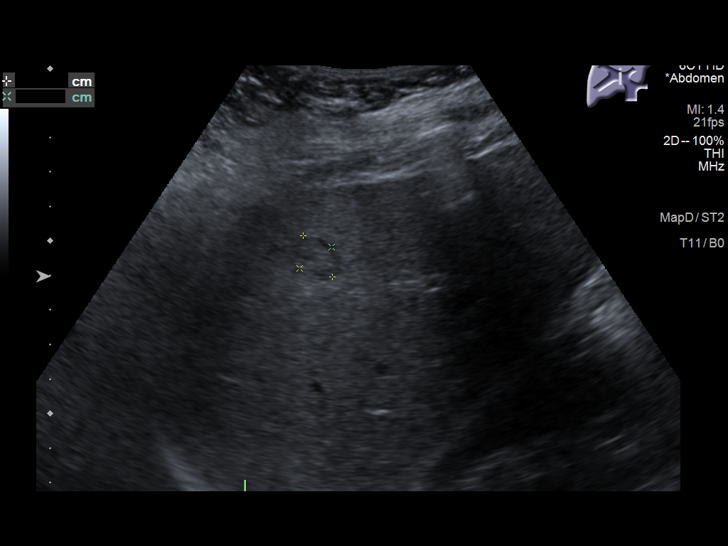
[im 36/54]
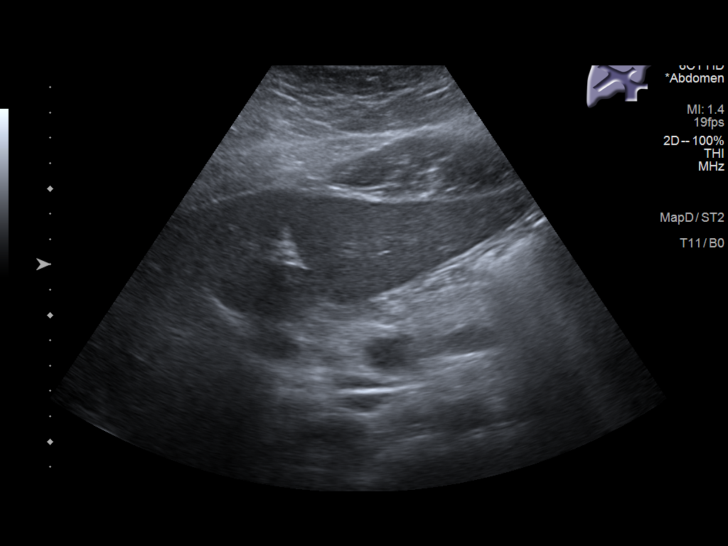
[im 40/54]
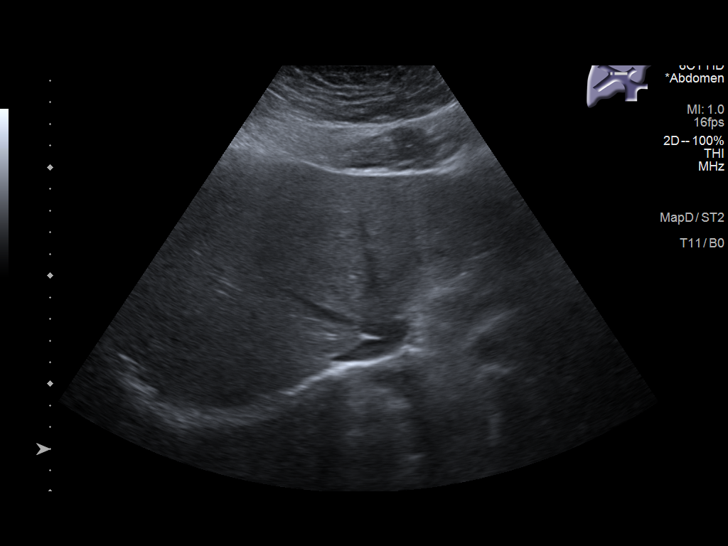
[im 45/54]
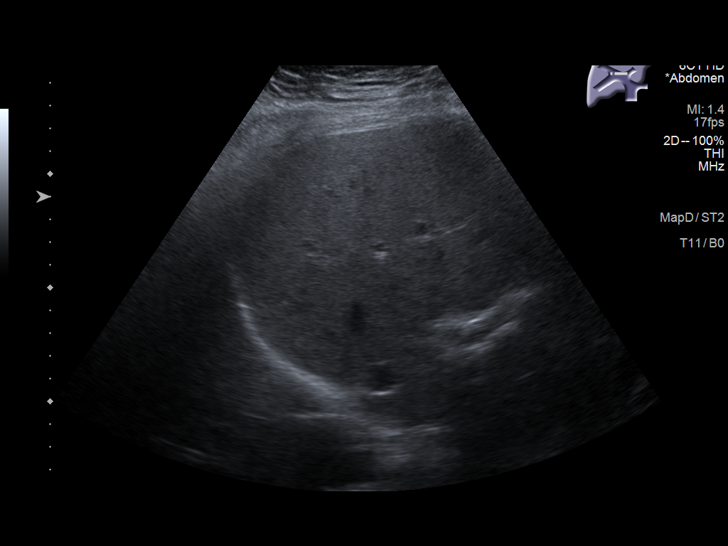
[im 49/54]
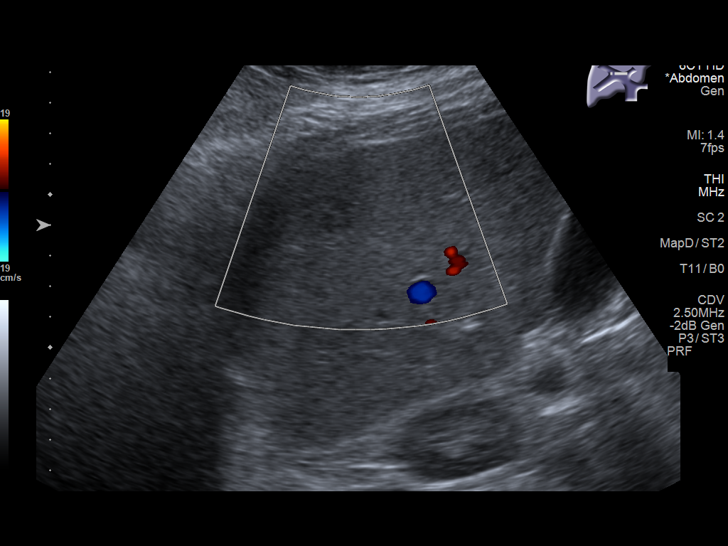
[im 54/54]
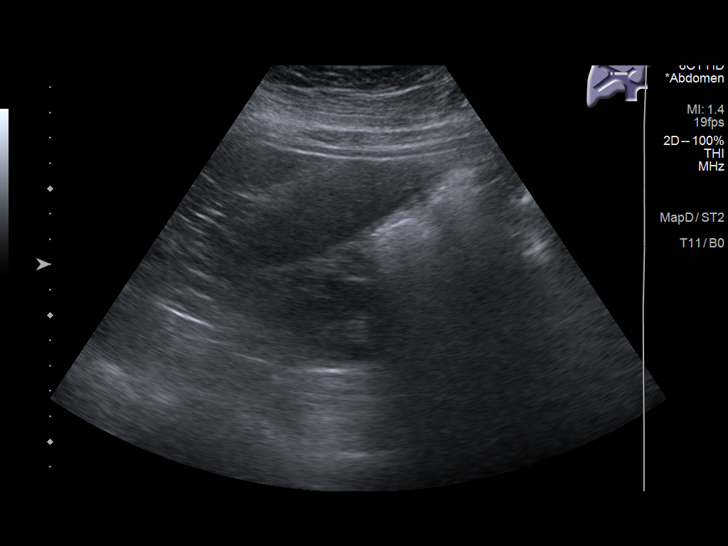

[13 of 25 positions shown; findings below may reference images not displayed]

FINDINGS: Gallbladder:

Tiny dependent shadowing calculi within gallbladder. No gallbladder
wall thickening, pericholecystic fluid or sonographic Murphy sign.

Common bile duct:

Diameter: 2 mm, normal

Liver:

Echogenic parenchyma, likely fatty infiltration though this can be
seen with cirrhosis and certain infiltrative disorders. Questionable
subtle nodule within RIGHT lobe liver 15 x 11 x 14 mm, slightly
hypoechoic to the background diffused increased hepatic
echogenicity. No additional masses or nodularity. Portal vein is
patent on color Doppler imaging with normal direction of blood flow
towards the liver.

Other: No RIGHT upper quadrant free fluid.
IMPRESSION: Cholelithiasis without evidence of acute cholecystitis.

Probable fatty infiltration of liver.

Subtle 15 mm nodule RIGHT lobe liver, could potentially represent a
small hemangioma which appears slightly hypoechoic versus liver
background due to the underlying increased hepatic echogenicity; as
this lesion is indeterminate, consider follow-up MR assessment in 6
months to characterize and assess stability.

## 2021-11-16 ENCOUNTER — Other Ambulatory Visit: Payer: Self-pay

## 2021-11-16 ENCOUNTER — Encounter
Admission: RE | Admit: 2021-11-16 | Discharge: 2021-11-16 | Disposition: A | Discharge status: Home / Self Care | Payer: BC Managed Care – PPO | Source: Ambulatory Visit | Attending: Surgery | Admitting: Surgery

## 2021-11-16 NOTE — Patient Instructions (Addendum)
Your procedure is scheduled on: 11/23/21 Report to [facility]. To find out your arrival time please call [phone] between 1PM - 3PM on  11/22/21.  Remember: Instructions that are not followed completely may result in serious medical risk, up to and including death, or upon the discretion of your surgeon and anesthesiologist your surgery may need to be rescheduled.     _X__ 1. Do not eat food after midnight the night before your procedure.                 No gum chewing or hard candies. You may drink clear liquids up to 2 hours                 before you are scheduled to arrive for your surgery- DO not drink clear                 liquids within 2 hours of the start of your surgery.                 Clear Liquids include:  water, apple juice without pulp, clear carbohydrate                 drink such as Clearfast or Gatorade, Black Coffee or Tea (Do not add                 anything to coffee or tea). Diabetics water only  __X__2.  On the morning of surgery brush your teeth with toothpaste and water, you                 may rinse your mouth with mouthwash if you wish.  Do not swallow any              toothpaste of mouthwash.     _X__ 3.  No Alcohol for 24 hours before or after surgery.   _X__ 4.  Do Not Smoke or use e-cigarettes For 24 Hours Prior to Your Surgery.                 Do not use any chewable tobacco products for at least 6 hours prior to                 surgery.  ____  5.  Bring all medications with you on the day of surgery if instructed.   __X__  6.  Notify your doctor if there is any change in your medical condition      (cold, fever, infections).     Do not wear jewelry, make-up, hairpins, clips or nail polish. Do not wear lotions, powders, or perfumes.  Do not shave body hair 48 hours prior to surgery. Men may shave face and neck. Do not bring valuables to the hospital.    [facility] is not responsible for any  belongings or valuables.  Contacts, dentures/partials or body piercings may not be worn into surgery. Bring a case for your contacts, glasses or hearing aids, a denture cup will be supplied. Leave your suitcase in the car. After surgery it may be brought to your room. For patients admitted to the hospital, discharge time is determined by your treatment team.   Patients discharged the day of surgery will not be allowed to drive home.   Please read over the following fact sheets that you were given:   CHG soap  __X__ Take these medicines the morning of surgery with A SIP OF  WATER:    1. NONE  2.   3.   4.  5.  6.  ____ Fleet Enema (as directed)   __X__ Use CHG Soap/SAGE wipes as directed  __X__ Use inhalers on the day of surgery  ____ Stop metformin/Janumet/Farxiga 2 days prior to surgery    ____ Take 1/2 of usual insulin dose the night before surgery. No insulin the morning          of surgery.   ____ Stop Blood Thinners Coumadin/Plavix/Xarelto/Pleta/Pradaxa/Eliquis/Effient/Aspirin  on   Or contact your Surgeon, Cardiologist or Medical Doctor regarding  ability to stop your blood thinners  __X__ Stop Anti-inflammatories 7 days before surgery such as Advil, Ibuprofen, Motrin,  BC or Goodies Powder, Naprosyn, Naproxen, Aleve, Aspirin    __X__ Stop all herbal supplements, fish oil or vitamin E until after surgery.    ____ Bring C-Pap to the hospital.      TYLENOL IS AN OPTION FOR PAIN RELIEF IF NEEDED

## 2021-11-17 ENCOUNTER — Other Ambulatory Visit
Admission: RE | Admit: 2021-11-17 | Discharge: 2021-11-17 | Disposition: A | Discharge status: Home / Self Care | Payer: BC Managed Care – PPO | Source: Ambulatory Visit | Attending: Surgery | Admitting: Surgery

## 2021-11-17 DIAGNOSIS — K8020 Calculus of gallbladder without cholecystitis without obstruction: Secondary | ICD-10-CM | POA: Diagnosis present

## 2021-11-17 LAB — COMPREHENSIVE METABOLIC PANEL
ALT: 19 U/L (ref 0–44)
AST: 17 U/L (ref 15–41)
Albumin: 3.8 g/dL (ref 3.5–5.0)
Alkaline Phosphatase: 39 U/L (ref 38–126)
Anion gap: 9 (ref 5–15)
BUN: 13 mg/dL (ref 6–20)
CO2: 24 mmol/L (ref 22–32)
Calcium: 9.1 mg/dL (ref 8.9–10.3)
Chloride: 105 mmol/L (ref 98–111)
Creatinine, Ser: 0.75 mg/dL (ref 0.44–1.00)
GFR, Estimated: >60 mL/min (ref >60)
Glucose, Bld: 98 mg/dL (ref 70–99)
Potassium: 4.1 mmol/L (ref 3.5–5.1)
Sodium: 138 mmol/L (ref 135–145)
Total Bilirubin: 0.6 mg/dL (ref 0.3–1.2)
Total Protein: 7.1 g/dL (ref 6.5–8.1)

## 2021-11-23 ENCOUNTER — Ambulatory Visit
Admission: RE | Admit: 2021-11-23 | Discharge: 2021-11-23 | Disposition: A | Discharge status: Home / Self Care | Payer: BC Managed Care – PPO | Attending: Surgery | Admitting: Surgery

## 2021-11-23 ENCOUNTER — Ambulatory Visit: Payer: BC Managed Care – PPO | Admitting: Anesthesiology

## 2021-11-23 ENCOUNTER — Encounter
Admission: RE | Disposition: A | Discharge status: Home / Self Care | Payer: Self-pay | Source: Home / Self Care | Attending: Surgery

## 2021-11-23 ENCOUNTER — Encounter: Payer: Self-pay | Admitting: Surgery

## 2021-11-23 DIAGNOSIS — K8010 Calculus of gallbladder with chronic cholecystitis without obstruction: Secondary | ICD-10-CM | POA: Insufficient documentation

## 2021-11-23 DIAGNOSIS — K8020 Calculus of gallbladder without cholecystitis without obstruction: Secondary | ICD-10-CM | POA: Diagnosis present

## 2021-11-23 DIAGNOSIS — R7989 Other specified abnormal findings of blood chemistry: Secondary | ICD-10-CM | POA: Diagnosis not present

## 2021-11-23 LAB — POCT PREGNANCY, URINE: Preg Test, Ur: NEGATIVE

## 2021-11-23 SURGERY — CHOLECYSTECTOMY, ROBOT-ASSISTED, LAPAROSCOPIC
Anesthesia: General

## 2021-11-23 MED ORDER — DEXMEDETOMIDINE (PRECEDEX) IN NS 20 MCG/5ML (4 MCG/ML) IV SYRINGE
PREFILLED_SYRINGE | INTRAVENOUS | Status: DC | PRN
  Administered 2021-11-23 (×2): 10 ug via INTRAVENOUS

## 2021-11-23 MED ORDER — PROPOFOL 10 MG/ML IV BOLUS
INTRAVENOUS | Status: AC
  Filled 2021-11-23: qty 20

## 2021-11-23 MED ORDER — ORAL CARE MOUTH RINSE: 15.0000 mL | Freq: Once | OROMUCOSAL | Status: AC

## 2021-11-23 MED ORDER — LIDOCAINE HCL (CARDIAC) PF 100 MG/5ML IV SOSY
PREFILLED_SYRINGE | INTRAVENOUS | Status: DC | PRN
  Administered 2021-11-23: 100 mg via INTRAVENOUS

## 2021-11-23 MED ORDER — INDOCYANINE GREEN 25 MG IV SOLR
2.5000 mg | INTRAVENOUS | Status: AC
  Administered 2021-11-23: 2.5 mg via INTRAVENOUS
  Filled 2021-11-23: qty 1

## 2021-11-23 MED ORDER — ACETAMINOPHEN 500 MG PO TABS: 1000.0000 mg | ORAL_TABLET | Freq: Four times a day (QID) | ORAL | Status: DC | PRN

## 2021-11-23 MED ORDER — HYDROMORPHONE HCL 1 MG/ML IJ SOLN
INTRAMUSCULAR | Status: DC | PRN
  Administered 2021-11-23: .5 mg via INTRAVENOUS

## 2021-11-23 MED ORDER — FAMOTIDINE 20 MG PO TABS
20.0000 mg | ORAL_TABLET | Freq: Once | ORAL | Status: AC
  Administered 2021-11-23: 20 mg via ORAL

## 2021-11-23 MED ORDER — LACTATED RINGERS IV SOLN: INTRAVENOUS | Status: DC

## 2021-11-23 MED ORDER — ONDANSETRON HCL 4 MG/2ML IJ SOLN
INTRAMUSCULAR | Status: AC
  Filled 2021-11-23: qty 2

## 2021-11-23 MED ORDER — BUPIVACAINE-EPINEPHRINE (PF) 0.25% -1:200000 IJ SOLN
INTRAMUSCULAR | Status: AC
  Filled 2021-11-23: qty 30

## 2021-11-23 MED ORDER — MIDAZOLAM HCL 2 MG/2ML IJ SOLN
INTRAMUSCULAR | Status: AC
  Filled 2021-11-23: qty 2

## 2021-11-23 MED ORDER — MIDAZOLAM HCL 2 MG/2ML IJ SOLN
INTRAMUSCULAR | Status: DC | PRN
  Administered 2021-11-23: 2 mg via INTRAVENOUS

## 2021-11-23 MED ORDER — GABAPENTIN 300 MG PO CAPS
ORAL_CAPSULE | ORAL | Status: AC
  Filled 2021-11-23: qty 1

## 2021-11-23 MED ORDER — IBUPROFEN 800 MG PO TABS: 800.0000 mg | ORAL_TABLET | Freq: Three times a day (TID) | ORAL | 1 refills | Status: DC | PRN

## 2021-11-23 MED ORDER — DEXAMETHASONE SODIUM PHOSPHATE 10 MG/ML IJ SOLN
INTRAMUSCULAR | Status: DC | PRN
  Administered 2021-11-23: 10 mg via INTRAVENOUS

## 2021-11-23 MED ORDER — SEVOFLURANE IN SOLN
RESPIRATORY_TRACT | Status: AC
  Filled 2021-11-23: qty 250

## 2021-11-23 MED ORDER — CHLORHEXIDINE GLUCONATE 0.12 % MT SOLN
15.0000 mL | Freq: Once | OROMUCOSAL | Status: AC
  Administered 2021-11-23: 15 mL via OROMUCOSAL

## 2021-11-23 MED ORDER — HYDROMORPHONE HCL 1 MG/ML IJ SOLN
INTRAMUSCULAR | Status: AC
  Filled 2021-11-23: qty 1

## 2021-11-23 MED ORDER — FENTANYL CITRATE (PF) 100 MCG/2ML IJ SOLN
INTRAMUSCULAR | Status: AC
  Filled 2021-11-23: qty 2

## 2021-11-23 MED ORDER — CHLORHEXIDINE GLUCONATE CLOTH 2 % EX PADS: 6.0000 | MEDICATED_PAD | Freq: Once | CUTANEOUS | Status: DC

## 2021-11-23 MED ORDER — FENTANYL CITRATE (PF) 100 MCG/2ML IJ SOLN
INTRAMUSCULAR | Status: AC
  Administered 2021-11-23: 25 ug via INTRAVENOUS
  Filled 2021-11-23: qty 2

## 2021-11-23 MED ORDER — CEFAZOLIN SODIUM-DEXTROSE 2-4 GM/100ML-% IV SOLN
2.0000 g | INTRAVENOUS | Status: AC
  Administered 2021-11-23: 2 g via INTRAVENOUS

## 2021-11-23 MED ORDER — PROMETHAZINE HCL 25 MG/ML IJ SOLN: 6.2500 mg | INTRAMUSCULAR | Status: DC | PRN

## 2021-11-23 MED ORDER — CHLORHEXIDINE GLUCONATE 0.12 % MT SOLN
OROMUCOSAL | Status: AC
  Filled 2021-11-23: qty 15

## 2021-11-23 MED ORDER — LIDOCAINE HCL (PF) 2 % IJ SOLN
INTRAMUSCULAR | Status: AC
  Filled 2021-11-23: qty 5

## 2021-11-23 MED ORDER — CEFAZOLIN SODIUM-DEXTROSE 2-4 GM/100ML-% IV SOLN
INTRAVENOUS | Status: AC
  Filled 2021-11-23: qty 100

## 2021-11-23 MED ORDER — BUPIVACAINE-EPINEPHRINE (PF) 0.25% -1:200000 IJ SOLN
INTRAMUSCULAR | Status: DC | PRN
  Administered 2021-11-23: 30 mL

## 2021-11-23 MED ORDER — ROCURONIUM BROMIDE 10 MG/ML (PF) SYRINGE
PREFILLED_SYRINGE | INTRAVENOUS | Status: AC
  Filled 2021-11-23: qty 10

## 2021-11-23 MED ORDER — SUCCINYLCHOLINE CHLORIDE 200 MG/10ML IV SOSY
PREFILLED_SYRINGE | INTRAVENOUS | Status: DC | PRN
  Administered 2021-11-23: 20 mg via INTRAVENOUS

## 2021-11-23 MED ORDER — DEXAMETHASONE SODIUM PHOSPHATE 10 MG/ML IJ SOLN
INTRAMUSCULAR | Status: AC
  Filled 2021-11-23: qty 1

## 2021-11-23 MED ORDER — KETOROLAC TROMETHAMINE 30 MG/ML IJ SOLN
INTRAMUSCULAR | Status: AC
  Filled 2021-11-23: qty 1

## 2021-11-23 MED ORDER — ONDANSETRON HCL 4 MG/2ML IJ SOLN
INTRAMUSCULAR | Status: DC | PRN
  Administered 2021-11-23: 4 mg via INTRAVENOUS

## 2021-11-23 MED ORDER — KETAMINE HCL 50 MG/5ML IJ SOSY
PREFILLED_SYRINGE | INTRAMUSCULAR | Status: AC
  Filled 2021-11-23: qty 5

## 2021-11-23 MED ORDER — KETAMINE HCL 10 MG/ML IJ SOLN
INTRAMUSCULAR | Status: DC | PRN
  Administered 2021-11-23: 20 mg via INTRAVENOUS

## 2021-11-23 MED ORDER — ROCURONIUM BROMIDE 100 MG/10ML IV SOLN
INTRAVENOUS | Status: DC | PRN
  Administered 2021-11-23: 50 mg via INTRAVENOUS
  Administered 2021-11-23: 20 mg via INTRAVENOUS
  Administered 2021-11-23: 10 mg via INTRAVENOUS

## 2021-11-23 MED ORDER — FENTANYL CITRATE (PF) 100 MCG/2ML IJ SOLN
INTRAMUSCULAR | Status: DC | PRN
  Administered 2021-11-23: 25 ug via INTRAVENOUS
  Administered 2021-11-23: 50 ug via INTRAVENOUS
  Administered 2021-11-23: 25 ug via INTRAVENOUS

## 2021-11-23 MED ORDER — GABAPENTIN 300 MG PO CAPS
300.0000 mg | ORAL_CAPSULE | ORAL | Status: AC
  Administered 2021-11-23: 300 mg via ORAL

## 2021-11-23 MED ORDER — ACETAMINOPHEN 500 MG PO TABS
ORAL_TABLET | ORAL | Status: AC
  Filled 2021-11-23: qty 2

## 2021-11-23 MED ORDER — ACETAMINOPHEN 500 MG PO TABS
1000.0000 mg | ORAL_TABLET | ORAL | Status: AC
  Administered 2021-11-23: 1000 mg via ORAL

## 2021-11-23 MED ORDER — SUGAMMADEX SODIUM 200 MG/2ML IV SOLN
INTRAVENOUS | Status: DC | PRN
  Administered 2021-11-23 (×2): 100 mg via INTRAVENOUS

## 2021-11-23 MED ORDER — KETOROLAC TROMETHAMINE 30 MG/ML IJ SOLN
INTRAMUSCULAR | Status: DC | PRN
  Administered 2021-11-23: 30 mg via INTRAVENOUS

## 2021-11-23 MED ORDER — ACETAMINOPHEN 325 MG PO TABS: 650.0000 mg | ORAL_TABLET | Freq: Four times a day (QID) | ORAL | Status: DC | PRN

## 2021-11-23 MED ORDER — OXYCODONE HCL 5 MG PO TABS: 5.0000 mg | ORAL_TABLET | ORAL | 0 refills | Status: DC | PRN

## 2021-11-23 MED ORDER — FENTANYL CITRATE (PF) 100 MCG/2ML IJ SOLN
25.0000 ug | INTRAMUSCULAR | Status: DC | PRN
  Administered 2021-11-23 (×3): 25 ug via INTRAVENOUS

## 2021-11-23 MED ORDER — HYDROCODONE-ACETAMINOPHEN 5-325 MG PO TABS: 1.0000 | ORAL_TABLET | ORAL | 0 refills | Status: DC | PRN

## 2021-11-23 MED ORDER — PROPOFOL 10 MG/ML IV BOLUS
INTRAVENOUS | Status: DC | PRN
  Administered 2021-11-23: 200 mg via INTRAVENOUS

## 2021-11-23 MED ORDER — FAMOTIDINE 20 MG PO TABS
ORAL_TABLET | ORAL | Status: AC
  Filled 2021-11-23: qty 1

## 2021-11-23 SURGICAL SUPPLY — 58 items
"PENCIL ELECTRO HAND CTR " (MISCELLANEOUS) ×1 IMPLANT
ADH SKN CLS APL DERMABOND .7 (GAUZE/BANDAGES/DRESSINGS) ×1
BAG INFUSER PRESSURE 100CC (MISCELLANEOUS) IMPLANT
BAG SPEC RTRVL LRG 6X4 10 (ENDOMECHANICALS) ×1
CANNULA REDUC XI 12-8 STAPL (CANNULA) ×1
CANNULA REDUCER 12-8 DVNC XI (CANNULA) ×1 IMPLANT
CLIP LIGATING HEMO O LOK GREEN (MISCELLANEOUS) ×2 IMPLANT
CUP MEDICINE 2OZ PLAST GRAD ST (MISCELLANEOUS) ×1 IMPLANT
DECANTER SPIKE VIAL GLASS SM (MISCELLANEOUS) ×2 IMPLANT
DERMABOND ADVANCED (GAUZE/BANDAGES/DRESSINGS) ×1
DERMABOND ADVANCED .7 DNX12 (GAUZE/BANDAGES/DRESSINGS) ×1 IMPLANT
DRAPE ARM DVNC X/XI (DISPOSABLE) ×4 IMPLANT
DRAPE COLUMN DVNC XI (DISPOSABLE) ×1 IMPLANT
DRAPE DA VINCI XI ARM (DISPOSABLE) ×4
DRAPE DA VINCI XI COLUMN (DISPOSABLE) ×1
ELECT CAUTERY BLADE TIP 2.5 (TIP) ×2
ELECT REM PT RETURN 9FT ADLT (ELECTROSURGICAL) ×2
ELECTRODE CAUTERY BLDE TIP 2.5 (TIP) ×1 IMPLANT
ELECTRODE REM PT RTRN 9FT ADLT (ELECTROSURGICAL) ×1 IMPLANT
GAUZE 4X4 16PLY ~~LOC~~+RFID DBL (SPONGE) ×2 IMPLANT
GLOVE SURG SYN 7.0 (GLOVE) ×6 IMPLANT
GLOVE SURG SYN 7.0 PF PI (GLOVE) ×2 IMPLANT
GLOVE SURG SYN 7.5  E (GLOVE) ×3
GLOVE SURG SYN 7.5 E (GLOVE) ×3 IMPLANT
GLOVE SURG SYN 7.5 PF PI (GLOVE) ×2 IMPLANT
GOWN STRL REUS W/ TWL LRG LVL3 (GOWN DISPOSABLE) ×4 IMPLANT
GOWN STRL REUS W/TWL LRG LVL3 (GOWN DISPOSABLE) ×6
IRRIGATOR SUCT 8 DISP DVNC XI (IRRIGATION / IRRIGATOR) IMPLANT
IRRIGATOR SUCTION 8MM XI DISP (IRRIGATION / IRRIGATOR)
IV NS 1000ML (IV SOLUTION)
IV NS 1000ML BAXH (IV SOLUTION) IMPLANT
KIT PINK PAD W/HEAD ARE REST (MISCELLANEOUS) ×2
KIT PINK PAD W/HEAD ARM REST (MISCELLANEOUS) ×1 IMPLANT
LABEL OR SOLS (LABEL) ×2 IMPLANT
MANIFOLD NEPTUNE II (INSTRUMENTS) ×2 IMPLANT
NEEDLE HYPO 22GX1.5 SAFETY (NEEDLE) ×2 IMPLANT
NS IRRIG 500ML POUR BTL (IV SOLUTION) ×2 IMPLANT
OBTURATOR OPTICAL STANDARD 8MM (TROCAR) ×1
OBTURATOR OPTICAL STND 8 DVNC (TROCAR) ×1
OBTURATOR OPTICALSTD 8 DVNC (TROCAR) ×1 IMPLANT
PACK LAP CHOLECYSTECTOMY (MISCELLANEOUS) ×2 IMPLANT
PENCIL ELECTRO HAND CTR (MISCELLANEOUS) ×2 IMPLANT
POUCH SPECIMEN RETRIEVAL 10MM (ENDOMECHANICALS) ×2 IMPLANT
SEAL CANN UNIV 5-8 DVNC XI (MISCELLANEOUS) ×3 IMPLANT
SEAL XI 5MM-8MM UNIVERSAL (MISCELLANEOUS) ×3
SET TUBE SMOKE EVAC HIGH FLOW (TUBING) ×2 IMPLANT
SOLUTION ELECTROLUBE (MISCELLANEOUS) ×2 IMPLANT
SPONGE T-LAP 18X18 ~~LOC~~+RFID (SPONGE) IMPLANT
SPONGE T-LAP 4X18 ~~LOC~~+RFID (SPONGE) ×1 IMPLANT
STAPLER CANNULA SEAL DVNC XI (STAPLE) ×1 IMPLANT
STAPLER CANNULA SEAL XI (STAPLE) ×1
SUT MNCRL AB 4-0 PS2 18 (SUTURE) ×2 IMPLANT
SUT VIC AB 3-0 SH 27 (SUTURE)
SUT VIC AB 3-0 SH 27X BRD (SUTURE) IMPLANT
SUT VICRYL 0 AB UR-6 (SUTURE) ×4 IMPLANT
TAPE TRANSPORE STRL 2 31045 (GAUZE/BANDAGES/DRESSINGS) ×2 IMPLANT
TROCAR BALLN GELPORT 12X130M (ENDOMECHANICALS) ×2 IMPLANT
WATER STERILE IRR 500ML POUR (IV SOLUTION) ×2 IMPLANT

## 2021-11-23 NOTE — Interval H&P Note (Signed)
History and Physical Interval Note:  11/23/2021 12:23 PM  Margaret Grant [name]  has presented today for surgery, with the diagnosis of cholelithiasis.  The various methods of treatment have been discussed with the patient and family. After consideration of risks, benefits and other options for treatment, the patient has consented to  Procedure(s): XI ROBOTIC ASSISTED LAPAROSCOPIC CHOLECYSTECTOMY (N/A) [place] (ICG) (N/A) as a surgical intervention.  The patient's history has been reviewed, patient examined, no change in status, stable for surgery.  I have reviewed the patient's chart and labs.  Questions were answered to the patient's satisfaction.     Ariyan Sinnett

## 2021-11-23 NOTE — Anesthesia Preprocedure Evaluation (Signed)
Anesthesia Evaluation  Patient identified by MRN, date of birth, ID band Patient awake    Reviewed: Allergy & Precautions, H&P , NPO status , Patient's Chart, lab work & pertinent test results, reviewed documented beta blocker date and time   History of Anesthesia Complications Negative for: history of anesthetic complications  Airway Mallampati: II  TM Distance: >3 FB Neck ROM: full    Dental  (+) Dental Advidsory Given, Teeth Intact   Pulmonary neg shortness of breath, asthma , neg sleep apnea, neg recent URI, former smoker,    Pulmonary exam normal breath sounds clear to auscultation       Cardiovascular Exercise Tolerance: Good negative cardio ROS Normal cardiovascular exam Rhythm:regular Rate:Normal     Neuro/Psych PSYCHIATRIC DISORDERS Anxiety Depression negative neurological ROS     GI/Hepatic Neg liver ROS, GERD  ,  Endo/Other  neg diabetesMorbid obesity  Renal/GU negative Renal ROS  negative genitourinary   Musculoskeletal   Abdominal   Peds  Hematology negative hematology ROS (+)   Anesthesia Other Findings Past Medical History: No date: Allergy No date: Anxiety No date: Asthma No date: Labial cyst No date: Panic attacks No date: Pilonidal cyst   Reproductive/Obstetrics negative OB ROS                             Anesthesia Physical Anesthesia Plan  ASA: 3  Anesthesia Plan: General   Post-op Pain Management:    Induction: Intravenous  PONV Risk Score and Plan: 3 and Ondansetron, Dexamethasone and Treatment may vary due to age or medical condition  Airway Management Planned: Oral ETT  Additional Equipment:   Intra-op Plan:   Post-operative Plan: Extubation in OR  Informed Consent: I have reviewed the patients History and Physical, chart, labs and discussed the procedure including the risks, benefits and alternatives for the proposed anesthesia with the  patient or authorized representative who has indicated his/her understanding and acceptance.     Dental Advisory Given  Plan Discussed with: Anesthesiologist, CRNA and Surgeon  Anesthesia Plan Comments:         Anesthesia Quick Evaluation

## 2021-11-23 NOTE — Anesthesia Procedure Notes (Addendum)
Procedure Name: Intubation Date/Time: 11/23/2021 12:54 PM Performed by: Loletha Grayer, CRNA Pre-anesthesia Checklist: Patient identified, Patient being monitored, Timeout performed, Emergency Drugs available and Suction available Patient Re-evaluated:Patient Re-evaluated prior to induction Oxygen Delivery Method: Circle system utilized Preoxygenation: Pre-oxygenation with 100% oxygen Induction Type: IV induction Ventilation: Mask ventilation without difficulty Laryngoscope Size: Mac and 3 Grade View: Grade I Tube type: Oral Tube size: 7.0 mm Number of attempts: 1 Airway Equipment and Method: Stylet Placement Confirmation: ETT inserted through vocal cords under direct vision, positive ETCO2 and breath sounds checked- equal and bilateral Secured at: 21 cm Tube secured with: Tape Dental Injury: Teeth and Oropharynx as per pre-operative assessment

## 2021-11-23 NOTE — Transfer of Care (Signed)
Immediate Anesthesia Transfer of Care Note  Patient: Margaret Grant  Procedure(s) Performed: XI ROBOTIC ASSISTED LAPAROSCOPIC CHOLECYSTECTOMY INDOCYANINE GREEN FLUORESCENCE IMAGING (ICG)  Patient Location: PACU  Anesthesia Type:General  Level of Consciousness: awake, alert  and patient cooperative  Airway & Oxygen Therapy: Patient Spontanous Breathing  Post-op Assessment: Report given to RN and Post -op Vital signs reviewed and stable  Post vital signs: Reviewed and stable  Last Vitals:  Vitals Value Taken Time  BP 127/68 11/23/21 1453  Temp 36 C 11/23/21 1453  Pulse 106 11/23/21 1455  Resp 15 11/23/21 1455  SpO2 100 % 11/23/21 1455    Last Pain:  Vitals:   11/23/21 1111  TempSrc: Tympanic  PainSc: 0-No pain         Complications: No notable events documented.

## 2021-11-23 NOTE — Op Note (Signed)
°  Procedure Date:  11/23/2021  Pre-operative Diagnosis:  Symptomatic Cholelithiasis  Post-operative Diagnosis:  Symptomatic cholelithiasis  Procedure:  Robotic assisted cholecystectomy with ICG FireFly cholangiogram  Surgeon:  Melvyn Neth, MD  Assistant:  Rosita Fire, PA-S  Anesthesia:  General endotracheal  Estimated Blood Loss:  10 ml  Specimens:  gallbladder  Complications:  None  Indications for Procedure:  This is a 26 y.o. female who presents with abdominal pain and workup revealing symptomatic cholelithiasis.  The benefits, complications, treatment options, and expected outcomes were discussed with the patient. The risks of bleeding, infection, recurrence of symptoms, failure to resolve symptoms, bile duct damage, bile duct leak, retained common bile duct stone, bowel injury, and need for further procedures were all discussed with the patient and she was willing to proceed.  Description of Procedure: The patient was correctly identified in the preoperative area and brought into the operating room.  The patient was placed supine with VTE prophylaxis in place.  Appropriate time-outs were performed.  Anesthesia was induced and the patient was intubated.  Appropriate antibiotics were infused.  The abdomen was prepped and draped in a sterile fashion. An infraumbilical incision was made. A cutdown technique was used to enter the abdominal cavity without injury, and a 12 mm robotic port was inserted.  Pneumoperitoneum was obtained with appropriate opening pressures.  Three 8-mm ports were placed in the mid abdomen at the level of the umbilicus under direct visualization.  The DaVinci platform was docked, camera targeted, and instruments were placed under direct visualization.  The gallbladder was identified.  The fundus was grasped and retracted cephalad.  Adhesions were lysed bluntly and with electrocautery. The infundibulum was grasped and retracted laterally, exposing the  peritoneum overlying the gallbladder.  This was incised with electrocautery and extended on either side of the gallbladder.  FireFly cholangiogram was then obtained, and we were able to clearly identify the cystic duct and common bile duct.  The cystic duct and cystic artery were carefully dissected with combination of cautery and blunt dissection.  Both were clipped twice proximally and once distally, cutting in between.  The gallbladder was taken from the gallbladder fossa in a retrograde fashion with electrocautery. The gallbladder was placed in an Endocatch bag. The liver bed was inspected and any bleeding was controlled with electrocautery. The right upper quadrant was then inspected again revealing intact clips, no bleeding, and no ductal injury.  The 8 mm ports were removed under direct visualization and the 12 mm port was removed.  The Endocatch bag was brought out via the umbilical incision. The fascial opening was closed using 0 vicryl suture.  Local anesthetic was infused in all incisions and the incisions were closed with 4-0 Monocryl.  The wounds were cleaned and sealed with DermaBond.  The patient was emerged from anesthesia and extubated and brought to the recovery room for further management.  The patient tolerated the procedure well and all counts were correct at the end of the case.   Melvyn Neth, MD

## 2021-11-23 NOTE — Discharge Instructions (Signed)
AMBULATORY SURGERY  DISCHARGE INSTRUCTIONS   The drugs that you were given will stay in your system until tomorrow so for the next 24 hours you should not:  Drive an automobile Make any legal decisions Drink any alcoholic beverage   You may resume regular meals tomorrow.  Today it is better to start with liquids and gradually work up to solid foods.  You may eat anything you prefer, but it is better to start with liquids, then soup and crackers, and gradually work up to solid foods.   Please notify your doctor immediately if you have any unusual bleeding, trouble breathing, redness and pain at the surgery site, drainage, fever, or pain not relieved by medication.       Please contact your physician with any problems or Same Day Surgery at [phone], Monday through Friday 6 am to 4 pm, or [facility] number at [phone].  

## 2021-11-25 ENCOUNTER — Ambulatory Visit (INDEPENDENT_AMBULATORY_CARE_PROVIDER_SITE_OTHER): Payer: BC Managed Care – PPO | Admitting: Surgery

## 2021-11-25 ENCOUNTER — Other Ambulatory Visit: Payer: Self-pay

## 2021-11-25 ENCOUNTER — Encounter: Payer: Self-pay | Admitting: Surgery

## 2021-11-25 VITALS — BP 111/86 | HR 85 | Temp 98.3°F | Ht 66.0 in | Wt 255.0 lb

## 2021-11-25 DIAGNOSIS — K8020 Calculus of gallbladder without cholecystitis without obstruction: Secondary | ICD-10-CM

## 2021-11-25 DIAGNOSIS — T8149XA Infection following a procedure, other surgical site, initial encounter: Secondary | ICD-10-CM

## 2021-11-25 DIAGNOSIS — Z09 Encounter for follow-up examination after completed treatment for conditions other than malignant neoplasm: Secondary | ICD-10-CM

## 2021-11-25 LAB — SURGICAL PATHOLOGY

## 2021-11-25 MED ORDER — CEPHALEXIN 500 MG PO CAPS: 500.0000 mg | ORAL_CAPSULE | Freq: Two times a day (BID) | ORAL | 0 refills | Status: AC

## 2021-11-25 NOTE — Patient Instructions (Addendum)
We are going to send a prescription for antibiotics to your pharmacy to cover for any infection.  Change your dressing twice a day or more if needed until it stops draining.  Tuck some of the dry gauze into your belly button to help draw out the drainage.   You may shower, remove the dressing first and let the warm soapy run over the area, rinse well, pat dry and redress the area.  Follow up as scheduled. Call us if you start to develop a fever over 100, spreading redness, or sharp increase in pain.   GENERAL POST-OPERATIVE PATIENT INSTRUCTIONS   WOUND CARE INSTRUCTIONS:  Keep a dry clean dressing on the wound if there is drainage. The initial bandage may be removed after 24 hours.  Once the wound has quit draining you may leave it open to air.  If clothing rubs against the wound or causes irritation and the wound is not draining you may cover it with a dry dressing during the daytime.  Try to keep the wound dry and avoid ointments on the wound unless directed to do so.  If the wound becomes bright red and painful or starts to drain infected material that is not clear, please contact your physician immediately.  If the wound is mildly pink and has a thick firm ridge underneath it, this is normal, and is referred to as a healing ridge.  This will resolve over the next 4-6 weeks.  BATHING: You may shower if you have been informed of this by your surgeon. However, Please do not submerge in a tub, hot tub, or pool until incisions are completely sealed or have been told by your surgeon that you may do so.  DIET:  You may eat any foods that you can tolerate.  It is a good idea to eat a high fiber diet and take in plenty of fluids to prevent constipation.  If you do become constipated you may want to take a mild laxative or take ducolax tablets on a daily basis until your bowel habits are regular.  Constipation can be very uncomfortable, along with straining, after recent surgery.  ACTIVITY:  You are  encouraged to cough and deep breath or use your incentive spirometer if you were given one, every 15-30 minutes when awake.  This will help prevent respiratory complications and low grade fevers post-operatively if you had a general anesthetic.  You may want to hug a pillow when coughing and sneezing to add additional support to the surgical area, if you had abdominal or chest surgery, which will decrease pain during these times.  You are encouraged to walk and engage in light activity for the next two weeks.  You should not lift more than 20 pounds for 4 weeks after surgery as it could put you at increased risk for complications.  Twenty pounds is roughly equivalent to a plastic bag of groceries. At that time- Listen to your body when lifting, if you have pain when lifting, stop and then try again in a few days. Soreness after doing exercises or activities of daily living is normal as you get back in to your normal routine.  MEDICATIONS:  Try to take narcotic medications and anti-inflammatory medications, such as tylenol, ibuprofen, naprosyn, etc., with food.  This will minimize stomach upset from the medication.  Should you develop nausea and vomiting from the pain medication, or develop a rash, please discontinue the medication and contact your physician.  You should not drive, make important decisions,  or operate machinery when taking narcotic pain medication.  SUNBLOCK Use sun block to incision area over the next year if this area will be exposed to sun. This helps decrease scarring and will allow you avoid a permanent darkened area over your incision.  QUESTIONS:  Please feel free to call our office if you have any questions, and we will be glad to assist you.

## 2021-11-25 NOTE — Progress Notes (Signed)
11/25/2021  HPI: Margaret Grant is a 26 y.o. female s/p robotic assisted cholecystectomy on 11/23/2021.  Patient called the office this morning because yesterday she started noticing worsening pain at the umbilicus with drainage of bloody tinged fluid.  She denies any fevers or chills also reports having some redness around the incision.  Reports that the other incisions are doing well.  Vital signs: BP 111/86    Pulse 85    Temp 98.3 F (36.8 C)    Ht 5\' 6"  (1.676 m)    Wt 255 lb (115.7 kg)    LMP 11/04/2021 (Approximate)    SpO2 97%    BMI 41.16 kg/m    Physical Exam: Constitutional: No acute distress Abdomen: Soft, nondistended, with some tenderness to palpation of the umbilicus.  Dermabond partially peeled off the umbilicus.  There is evidence of prior serosanguineous drainage with no purulence.  The skin of the umbilicus and the deeper portion of the umbilical stalk itself has some erythema.  No large open wound visible.   Assessment/Plan: This is a 26 y.o. female s/p robotic assisted cholecystectomy.  - Discussed with the patient there is an area in the deeper portion of the umbilicus that looks very irritated and could be as a result of the surgery.  During the surgery, her umbilical stalk was found to be very deep and we had to do some sutures in that area that could potentially be causing this irritation.  As a precaution, we will give her a 1 week course of Keflex for potential infection.  Discussed with her also daily dressing changes.  If there is no improvement with Keflex over the weekend, she is aware to give [place] a call so we can see her for further evaluation.  Otherwise if things are improving, she will keep her currently scheduled follow-up on 12/07/2021. - All questions answered.   Melvyn Neth, [facility]

## 2021-11-26 NOTE — Anesthesia Postprocedure Evaluation (Signed)
Anesthesia Post Note  Patient: Haelyn Forgey Loser  Procedure(s) Performed: XI ROBOTIC ASSISTED LAPAROSCOPIC CHOLECYSTECTOMY INDOCYANINE GREEN FLUORESCENCE IMAGING (ICG)  Patient location during evaluation: PACU Anesthesia Type: General Level of consciousness: awake and alert Pain management: pain level controlled Vital Signs Assessment: post-procedure vital signs reviewed and stable Respiratory status: spontaneous breathing, nonlabored ventilation, respiratory function stable and patient connected to nasal cannula oxygen Cardiovascular status: blood pressure returned to baseline and stable Postop Assessment: no apparent nausea or vomiting Anesthetic complications: no   No notable events documented.   Last Vitals:  Vitals:   11/23/21 1600 11/23/21 1614  BP: 114/61 119/76  Pulse: 78 76  Resp: 16 16  Temp:  (!) 36.4 C  SpO2: 96% 92%    Last Pain:  Vitals:   11/24/21 0951  TempSrc:   PainSc: 3                  Martha Clan

## 2021-12-07 ENCOUNTER — Other Ambulatory Visit: Payer: Self-pay

## 2021-12-07 ENCOUNTER — Ambulatory Visit (INDEPENDENT_AMBULATORY_CARE_PROVIDER_SITE_OTHER): Payer: BC Managed Care – PPO | Admitting: Physician Assistant

## 2021-12-07 ENCOUNTER — Encounter: Payer: Self-pay | Admitting: Physician Assistant

## 2021-12-07 VITALS — BP 116/80 | HR 68 | Temp 98.1°F | Ht 66.0 in | Wt 243.0 lb

## 2021-12-07 DIAGNOSIS — Z09 Encounter for follow-up examination after completed treatment for conditions other than malignant neoplasm: Secondary | ICD-10-CM

## 2021-12-07 DIAGNOSIS — K8020 Calculus of gallbladder without cholecystitis without obstruction: Secondary | ICD-10-CM

## 2021-12-07 NOTE — Progress Notes (Signed)
[facility] POST-OP OFFICE VISIT  12/07/2021  HPI: Margaret Grant is a 26 y.o. female 14 days s/p robotic assisted laparoscopic cholecystectomy for symptomatic cholelithiasis with Dr Hampton Abbot.   She was seen on 01/13 for concerns of drainage from her umbilical wound. She was started on Keflex as a precaution.   Today, she reports that she is doing much better. Drainage has subsided. The incision is closed. She has completed Abx without issues. Still with some soreness at umbilicus but overall improved. No fever, chills, nausea, emesis. She has occasional diarrhea depending on what she eats. No other complaints.   Vital signs: BP 116/80    Pulse 68    Temp 98.1 F (36.7 C) (Oral)    Ht 5\' 6"  (1.676 m)    Wt 243 lb (110.2 kg)    LMP 12/03/2021    SpO2 97%    BMI 39.22 kg/m    Physical Exam: Constitutional: Well appearing female, NAD Abdomen: Soft, non-tender, non-distended, no rebound/guarding Skin: Laparoscopic incisions are healing well, no erythema or drainage. Her umbilical wound is completely closed, no erythema or drainage. Probing of her umbilicus did not reveal any wounds. She is certainly without any underlying abscess nor infection.    Assessment/Plan: This is a 26 y.o. female 14 days s/p robotic assisted laparoscopic cholecystectomy for symptomatic cholelithiasis    - Recommend avoiding or limiting fatty/greasy foods over the next few days/week to prevent diarrhea. She understands this should normalize with time.   - Pain control prn  - Reviewed wound care recommendation; nothing further  - Reviewed lifting restrictions; 4 weeks total  - Reviewed surgical pathology; [place]  - She can follow up on as needed basis; She understands to call with questions/concerns  -- Edison Simon, PA-C [facility] 12/07/2021, 10:45 AM [phone] M-F: 7am - 4pm

## 2021-12-07 NOTE — Patient Instructions (Signed)
If you have any concerns or questions, please feel free to call our office. Follow up as needed.   GENERAL POST-OPERATIVE PATIENT INSTRUCTIONS   WOUND CARE INSTRUCTIONS:  Keep a dry clean dressing on the wound if there is drainage. The initial bandage may be removed after 24 hours.  Once the wound has quit draining you may leave it open to air.  If clothing rubs against the wound or causes irritation and the wound is not draining you may cover it with a dry dressing during the daytime.  Try to keep the wound dry and avoid ointments on the wound unless directed to do so.  If the wound becomes bright red and painful or starts to drain infected material that is not clear, please contact your physician immediately.  If the wound is mildly pink and has a thick firm ridge underneath it, this is normal, and is referred to as a healing ridge.  This will resolve over the next 4-6 weeks.  BATHING: You may shower if you have been informed of this by your surgeon. However, Please do not submerge in a tub, hot tub, or pool until incisions are completely sealed or have been told by your surgeon that you may do so.  DIET:  You may eat any foods that you can tolerate.  It is a good idea to eat a high fiber diet and take in plenty of fluids to prevent constipation.  If you do become constipated you may want to take a mild laxative or take ducolax tablets on a daily basis until your bowel habits are regular.  Constipation can be very uncomfortable, along with straining, after recent surgery.  ACTIVITY:  You are encouraged to cough and deep breath or use your incentive spirometer if you were given one, every 15-30 minutes when awake.  This will help prevent respiratory complications and low grade fevers post-operatively if you had a general anesthetic.  You may want to hug a pillow when coughing and sneezing to add additional support to the surgical area, if you had abdominal or chest surgery, which will decrease pain  during these times.  You are encouraged to walk and engage in light activity for the next two weeks.  You should not lift,push, or pull more than 15-20 pounds, until 12/21/2021 as it could put you at increased risk for complications.  Twenty pounds is roughly equivalent to a plastic bag of groceries. At that time- Listen to your body when lifting, if you have pain when lifting, stop and then try again in a few days. Soreness after doing exercises or activities of daily living is normal as you get back in to your normal routine.  MEDICATIONS:  Try to take narcotic medications and anti-inflammatory medications, such as tylenol, ibuprofen, naprosyn, etc., with food.  This will minimize stomach upset from the medication.  Should you develop nausea and vomiting from the pain medication, or develop a rash, please discontinue the medication and contact your physician.  You should not drive, make important decisions, or operate machinery when taking narcotic pain medication.  SUNBLOCK Use sun block to incision area over the next year if this area will be exposed to sun. This helps decrease scarring and will allow you avoid a permanent darkened area over your incision.  QUESTIONS:  Please feel free to call our office if you have any questions, and we will be glad to assist you.

## 2021-12-14 ENCOUNTER — Telehealth: Payer: Self-pay

## 2021-12-14 NOTE — Telephone Encounter (Signed)
Copied from [place] [id number]. Topic: General - Other >> Dec 14, 2021  4:13 PM Margaret Grant wrote: Reason for CRM: FYI- Icalled pt to get MRI scheduled for her 6 month F/U . She states that she was scanned at [facility] and the lesion on he liver is no longer there. I am closing the order out,Thanks

## 2022-01-17 ENCOUNTER — Ambulatory Visit (INDEPENDENT_AMBULATORY_CARE_PROVIDER_SITE_OTHER): Payer: BC Managed Care – PPO | Admitting: Family Medicine

## 2022-01-17 ENCOUNTER — Other Ambulatory Visit: Payer: Self-pay

## 2022-01-17 ENCOUNTER — Encounter: Payer: Self-pay | Admitting: Family Medicine

## 2022-01-17 VITALS — BP 119/73 | HR 92 | Temp 98.3°F | Resp 14 | Ht 66.0 in | Wt 256.0 lb

## 2022-01-17 DIAGNOSIS — D1803 Hemangioma of intra-abdominal structures: Secondary | ICD-10-CM

## 2022-01-17 DIAGNOSIS — F32A Depression, unspecified: Secondary | ICD-10-CM

## 2022-01-17 DIAGNOSIS — J45990 Exercise induced bronchospasm: Secondary | ICD-10-CM | POA: Diagnosis not present

## 2022-01-17 DIAGNOSIS — B009 Herpesviral infection, unspecified: Secondary | ICD-10-CM

## 2022-01-17 DIAGNOSIS — Z0000 Encounter for general adult medical examination without abnormal findings: Secondary | ICD-10-CM | POA: Diagnosis not present

## 2022-01-17 DIAGNOSIS — F419 Anxiety disorder, unspecified: Secondary | ICD-10-CM | POA: Diagnosis not present

## 2022-01-17 DIAGNOSIS — H6121 Impacted cerumen, right ear: Secondary | ICD-10-CM | POA: Insufficient documentation

## 2022-01-17 DIAGNOSIS — K649 Unspecified hemorrhoids: Secondary | ICD-10-CM | POA: Insufficient documentation

## 2022-01-17 MED ORDER — ALBUTEROL SULFATE (2.5 MG/3ML) 0.083% IN NEBU: 2.5000 mg | INHALATION_SOLUTION | Freq: Four times a day (QID) | RESPIRATORY_TRACT | 1 refills | Status: DC | PRN

## 2022-01-17 MED ORDER — DULOXETINE HCL 30 MG PO CPEP: 60.0000 mg | ORAL_CAPSULE | Freq: Every day | ORAL | 3 refills | Status: DC

## 2022-01-17 NOTE — Assessment & Plan Note (Signed)
Chronic, stable ?"unable to stop thoughts" "elevated heart rate thinking of all the things that need to be accomplished" ?Contracted to safety ?Stable on Cymbalta ? ?

## 2022-01-17 NOTE — Assessment & Plan Note (Signed)
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

## 2022-01-17 NOTE — Assessment & Plan Note (Signed)
Hx of wheezing with activity/exercise ?Due for inhaler refill ?Rx provided- pt will check brand ?

## 2022-01-17 NOTE — Progress Notes (Signed)
Argentina Ponder DeSanto,acting as a scribe for Gwyneth Sprout, FNP.,have documented all relevant documentation on the behalf of Gwyneth Sprout, FNP,as directed by  Gwyneth Sprout, FNP while in the presence of Gwyneth Sprout, FNP.    Complete physical exam   Patient: Margaret Grant   DOB: [birth year]   26 y.o. Female  MRN: [id number] Visit Date: 01/17/2022  Today's healthcare provider: Gwyneth Sprout, FNP  Patient re- Introduced to nurse practitioner role and practice setting.  All questions answered.  Discussed provider/patient relationship and expectations.   Chief Complaint  Patient presents with   Annual Exam   Subjective    Margaret Grant is a 26 y.o. female who presents today for a complete physical exam.  She reports consuming a general diet. Gym/ health club routine includes cardio. She generally feels fairly well. She reports sleeping fairly well. She does not have additional problems to discuss today.  HPI    Past Medical History:  Diagnosis Date   Allergy    Anxiety    Asthma    Labial cyst    Panic attacks    Pilonidal cyst    Past Surgical History:  Procedure Laterality Date   KNEE ARTHROSCOPY W/ MENISCAL REPAIR Right 2012   WISDOM TOOTH EXTRACTION  2017   Social History   Socioeconomic History   Marital status: Single    Spouse name: Not on file   Number of children: 0   Years of education: Not on file   Highest education level: Not on file  Occupational History   Not on file  Tobacco Use   Smoking status: Former    Types: Cigarettes   Smokeless tobacco: Former  Scientific laboratory technician Use: Never used  Substance and Sexual Activity   Alcohol use: Yes    Comment: rare   Drug use: Never   Sexual activity: Yes    Birth control/protection: None  Other Topics Concern   Not on file  Social History Narrative   Not on file   Social Determinants of Health   Financial Resource Strain: Not on file  Food Insecurity: Not on file  Transportation  Needs: Not on file  Physical Activity: Not on file  Stress: Not on file  Social Connections: Not on file  Intimate Partner Violence: Not on file   Family Status  Relation Name Status   Mother  (Not Specified)   Father  (Not Specified)   Mat Aunt  (Not Specified)   Mat Uncle  (Not Specified)   MGM  (Not Specified)   MGF  (Not Specified)   Cousin  (Not Specified)   Family History  Problem Relation Age of Onset   Hypertension Mother    Anxiety disorder Mother    Depression Mother    Drug abuse Father    Cancer Maternal Aunt        Brain   Cancer Maternal Uncle        Lung   Stroke Maternal Grandmother    COPD Maternal Grandmother    Hypotension Maternal Grandmother    Cancer Maternal Grandfather        Lung   Hypertension Maternal Grandfather    Anxiety disorder Cousin    Depression Cousin    Gout Cousin    No Known Allergies  Patient Care Team: Gwyneth Sprout, FNP as PCP - General (Family Medicine)   Medications: Outpatient Medications Prior to Visit  Medication Sig   traZODone ([place])  50 MG tablet Take 0.5-1 tablets (25-50 mg total) by mouth at bedtime as needed for sleep.   [DISCONTINUED] DULoxetine (CYMBALTA) 30 MG capsule Take 2 capsules (60 mg total) by mouth daily. Please schedule an office visit before anymore refills.   [DISCONTINUED] acetaminophen (TYLENOL) 325 MG tablet Take 2 tablets (650 mg total) by mouth every 6 (six) hours as needed for mild pain.   No facility-administered medications prior to visit.    Review of Systems  Constitutional: Negative.   HENT: Negative.    Eyes: Negative.   Respiratory: Negative.    Cardiovascular: Negative.   Gastrointestinal:  Positive for rectal pain.  Endocrine: Negative.   Genitourinary: Negative.   Musculoskeletal: Negative.   Skin: Negative.   Allergic/Immunologic: Positive for environmental allergies.  Neurological: Negative.   Hematological: Negative.   Psychiatric/Behavioral:  The patient is  nervous/anxious.      Objective    BP 119/73 (BP Location: Left Arm, Patient Position: Sitting, Cuff Size: Large)    Pulse 92    Temp 98.3 F (36.8 C) (Oral)    Resp 14    Ht _0  (1.676 m)    Wt 256 lb (116.1 kg)    LMP  (Within Weeks)    SpO2 98% Comment: room air   BMI 41.32 kg/m     Physical Exam Vitals and nursing note reviewed.  Constitutional:      General: She is awake. She is not in acute distress.    Appearance: Normal appearance. She is well-developed and well-groomed. She is obese. She is not ill-appearing, toxic-appearing or diaphoretic.  HENT:     Head: Normocephalic and atraumatic.     Jaw: There is normal jaw occlusion. No trismus, tenderness, swelling or pain on movement.     Right Ear: Hearing, tympanic membrane, ear canal and external ear normal. There is impacted cerumen.     Left Ear: Hearing, tympanic membrane, ear canal and external ear normal. There is no impacted cerumen.     Ears:     Comments: Wish try OTC agents to assist with excessive, sticky ear wax ex Debrox    Nose: Nose normal. No congestion or rhinorrhea.     Right Turbinates: Not enlarged, swollen or pale.     Left Turbinates: Not enlarged, swollen or pale.     Right Sinus: No maxillary sinus tenderness or frontal sinus tenderness.     Left Sinus: No maxillary sinus tenderness or frontal sinus tenderness.     Mouth/Throat:     Lips: Pink.     Mouth: Mucous membranes are moist. No injury.     Tongue: No lesions.     Pharynx: Oropharynx is clear. Uvula midline. No pharyngeal swelling, oropharyngeal exudate, posterior oropharyngeal erythema or uvula swelling.     Tonsils: No tonsillar exudate or tonsillar abscesses.  Eyes:     General: Lids are normal. Lids are everted, no foreign bodies appreciated. Vision grossly intact. Gaze aligned appropriately. No allergic shiner or visual field deficit.       Right eye: No discharge.        Left eye: No discharge.     Extraocular Movements: Extraocular  movements intact.     Conjunctiva/sclera: Conjunctivae normal.     Right eye: Right conjunctiva is not injected. No exudate.    Left eye: Left conjunctiva is not injected. No exudate.    Pupils: Pupils are equal, round, and reactive to light.  Neck:     Thyroid: No thyroid mass, thyromegaly  or thyroid tenderness.     Vascular: No carotid bruit.     Trachea: Trachea normal.  Cardiovascular:     Rate and Rhythm: Normal rate and regular rhythm.     Pulses: Normal pulses.          Carotid pulses are 2+ on the right side and 2+ on the left side.      Radial pulses are 2+ on the right side and 2+ on the left side.       Dorsalis pedis pulses are 2+ on the right side and 2+ on the left side.       Posterior tibial pulses are 2+ on the right side and 2+ on the left side.     Heart sounds: Normal heart sounds, S1 normal and S2 normal. No murmur heard.   No friction rub. No gallop.  Pulmonary:     Effort: Pulmonary effort is normal. No respiratory distress.     Breath sounds: Normal breath sounds and air entry. No stridor. No wheezing, rhonchi or rales.  Chest:     Chest wall: No tenderness.     Comments: Breasts: risk and benefit of breast self-exam was discussed, not examined   Abdominal:     General: Abdomen is flat. Bowel sounds are normal. There is no distension.     Palpations: Abdomen is soft. There is no mass.     Tenderness: There is no abdominal tenderness. There is no right CVA tenderness, left CVA tenderness, guarding or rebound.     Hernia: No hernia is present.  Genitourinary:    Rectum: External hemorrhoid present.     Comments: Exam deferred; complaints of hemorrhoids and rectal pain Encouraged use of fiber to assist bulking stool, stool to assist with positioning, ergonomics with lifting, work on weight reduction, discussion regarding OTC medications to try ex. Hydrocortisone cream  Musculoskeletal:        General: No swelling, tenderness, deformity or signs of injury.  Normal range of motion.     Cervical back: Full passive range of motion without pain, normal range of motion and neck supple. No edema, rigidity or tenderness. No muscular tenderness.     Right lower leg: No edema.     Left lower leg: No edema.  Lymphadenopathy:     Cervical: No cervical adenopathy.     Right cervical: No superficial, deep or posterior cervical adenopathy.    Left cervical: No superficial, deep or posterior cervical adenopathy.  Skin:    General: Skin is warm and dry.     Capillary Refill: Capillary refill takes less than 2 seconds.     Coloration: Skin is not jaundiced or pale.     Findings: No bruising, erythema, lesion or rash.  Neurological:     General: No focal deficit present.     Mental Status: She is alert and oriented to person, place, and time. Mental status is at baseline.     GCS: GCS eye subscore is 4. GCS verbal subscore is 5. GCS motor subscore is 6.     Sensory: Sensation is intact. No sensory deficit.     Motor: Motor function is intact. No weakness.     Coordination: Coordination is intact. Coordination normal.     Gait: Gait is intact. Gait normal.  Psychiatric:        Attention and Perception: Attention and perception normal.        Mood and Affect: Mood and affect normal.  Speech: Speech normal.        Behavior: Behavior normal. Behavior is cooperative.        Thought Content: Thought content normal.        Cognition and Memory: Cognition and memory normal.        Judgment: Judgment normal.     Last depression screening scores PHQ 2/9 Scores 01/17/2022 10/14/2021 07/20/2021  PHQ - 2 Score 0 0 0  PHQ- 9 Score 2 0 4   Last fall risk screening Fall Risk  10/14/2021  Falls in the past year? 0  Number falls in past yr: 0  Injury with Fall? 0  Risk for fall due to : No Fall Risks  Follow up Falls evaluation completed   Last Audit-C alcohol use screening Alcohol Use Disorder Test (AUDIT) 10/14/2021  1. How often do you have a drink  containing alcohol? 1  2. How many drinks containing alcohol do you have on a typical day when you are drinking? 1  3. How often do you have six or more drinks on one occasion? 0  AUDIT-C Score 2   A score of 3 or more in women, and 4 or more in men indicates increased risk for alcohol abuse, EXCEPT if all of the points are from question 1   No results found for any visits on 01/17/22.  Assessment & Plan    Routine Health Maintenance and Physical Exam  Exercise Activities and Dietary recommendations  Goals   None     Immunization History  Administered Date(s) Administered   DTaP 11/24/1996, 05/04/1997, 07/17/1997, 04/06/1998, 12/12/2000   Hepatitis B 12/17/1995, 10/17/1996, 05/04/1997   HiB (PRP-OMP) 11/24/1996, 05/04/1997, 07/17/1997, 04/06/1998   Hpv-Unspecified 06/17/2009, 06/26/2014, 12/09/2014   IPV 11/24/1996, 05/04/1997, 07/17/1997, 12/12/2000   Influenza,inj,Quad PF,6+ Mos 09/28/2020   MMR 04/06/1998, 12/12/2000, 03/11/2021   Meningococcal Conjugate 06/26/2014   Td 08/03/2008   Tdap 08/03/2008, 11/13/2013, 12/24/2020   Varicella 06/26/2014, 09/17/2014, 03/11/2021    Health Maintenance  Topic Date Due   Hepatitis C Screening  Never done   COVID-19 Vaccine (3 - Booster) 04/08/2021   INFLUENZA VACCINE  02/10/2022 (Originally 06/13/2021)   PAP-Cervical Cytology Screening  01/14/2023   PAP SMEAR-Modifier  01/14/2023   TETANUS/TDAP  12/24/2030   HPV VACCINES  Completed   HIV Screening  Completed    Discussed health benefits of physical activity, and encouraged her to engage in regular exercise appropriate for her age and condition.  Problem List Items Addressed This Visit       Cardiovascular and Mediastinum   Hemorrhoids   Liver hemangioma    Previously suspected; however, not seen on recent imaging; treat for NAFL recommend diet low in saturated fat and regular exercise - 30 min at least 5 times per week Defer previously planned imaging at this time         Respiratory   Exercise-induced asthma    Hx of wheezing with activity/exercise Due for inhaler refill Rx provided- pt will check brand      Relevant Medications   albuterol (PROVENTIL) (2.5 MG/3ML) 0.083% nebulizer solution     Nervous and Auditory   Excessive cerumen in ear canal, right     Other   Annual physical exam - Primary    UTD on dental UTD on vision Things to do to keep yourself healthy  - Exercise at least 30-45 minutes a day, 3-4 days a week.  - Eat a low-fat diet with lots of fruits and vegetables, up to  7-9 servings per day.  - Seatbelts can save your life. Wear them always.  - Smoke detectors on every level of your home, check batteries every year.  - Eye Doctor - have an eye exam every 1-2 years  - Safe sex - if you may be exposed to STDs, use a condom.  - Alcohol -  If you drink, do it moderately, less than 2 drinks per day.  - [place]. Choose someone to speak for you if you are not able.  - Depression is common in our stressful world.If you're feeling down or losing interest in things you normally enjoy, please come in for a visit.  - Violence - If anyone is threatening or hurting you, please call immediately.        Anxiety and depression    Chronic, stable "unable to stop thoughts" "elevated heart rate thinking of all the things that need to be accomplished" Contracted to safety Stable on Cymbalta       Relevant Medications   DULoxetine (CYMBALTA) 30 MG capsule   HSV-1 infection    Hx of cold sores Infrequent Typically relieved with 2-3 days of BID treatment Rx stable; denies need for refills      Morbid obesity (HCC)    Body mass index is 41.32 kg/m. Discussed importance of healthy weight management Discussed diet and exercise Weight fluctuating- continue to recommend self care promotion, plans to try to additional child later this year Daughter will be 1 02/2022        Return in about 1 year (around 01/18/2023) for  annual examination.     Vonna Kotyk, FNP, have reviewed all documentation for this visit. The documentation on 01/17/22 for the exam, diagnosis, procedures, and orders are all accurate and complete.    Gwyneth Sprout, [place] [phone] (phone) [phone] (fax)  [place]

## 2022-01-17 NOTE — Assessment & Plan Note (Signed)
UTD on dental °UTD on vision °Things to do to keep yourself healthy  °- Exercise at least 30-45 minutes a day, 3-4 days a week.  °- Eat a low-fat diet with lots of fruits and vegetables, up to 7-9 servings per day.  °- Seatbelts can save your life. Wear them always.  °- Smoke detectors on every level of your home, check batteries every year.  °- Eye Doctor - have an eye exam every 1-2 years  °- Safe sex - if you may be exposed to STDs, use a condom.  °- Alcohol -  If you drink, do it moderately, less than 2 drinks per day.  °- Health Care Power of Attorney. Choose someone to speak for you if you are not able.  °- Depression is common in our stressful world.If you're feeling down or losing interest in things you normally enjoy, please come in for a visit.  °- Violence - If anyone is threatening or hurting you, please call immediately. ° ° °

## 2022-01-17 NOTE — Assessment & Plan Note (Signed)
Hx of cold sores ?Infrequent ?Typically relieved with 2-3 days of BID treatment ?Rx stable; denies need for refills ?

## 2022-01-17 NOTE — Assessment & Plan Note (Signed)
Body mass index is 41.32 kg/m?. ?Discussed importance of healthy weight management ?Discussed diet and exercise ?Weight fluctuating- continue to recommend self care promotion, plans to try to additional child later this year ?Daughter will be 1 02/2022 ?

## 2022-09-21 ENCOUNTER — Other Ambulatory Visit: Payer: Self-pay | Admitting: Family Medicine

## 2022-09-21 DIAGNOSIS — F32A Depression, unspecified: Secondary | ICD-10-CM

## 2022-09-25 ENCOUNTER — Encounter: Payer: Self-pay | Admitting: Family Medicine

## 2022-09-25 ENCOUNTER — Other Ambulatory Visit: Payer: Self-pay | Admitting: Family Medicine

## 2022-09-25 DIAGNOSIS — F419 Anxiety disorder, unspecified: Secondary | ICD-10-CM

## 2022-09-25 MED ORDER — DULOXETINE HCL 30 MG PO CPEP: 60.0000 mg | ORAL_CAPSULE | Freq: Every day | ORAL | 0 refills | Status: DC

## 2022-09-27 NOTE — Telephone Encounter (Signed)
Unable to refill per protocol, Rx was refilled 09/25/22 for 90. Will refuse duplicate request.  Requested Prescriptions  Pending Prescriptions Disp Refills   DULoxetine (CYMBALTA) 30 MG capsule 180 capsule 0    Sig: Take 2 capsules (60 mg total) by mouth daily.     Psychiatry: Antidepressants - SNRI - duloxetine Failed - 09/27/2022  2:23 PM      Failed - Valid encounter within last 6 months    Recent Outpatient Visits           8 months ago Annual physical exam   [facility] Gwyneth Sprout, FNP   11 months ago Hospital discharge follow-up   [facility] Tally Joe T, FNP   1 year ago Anxiety and depression   [facility] Gwyneth Sprout, FNP   1 year ago Anxiety and depression   [facility] Gwyneth Sprout, FNP   1 year ago RUQ pain   [facility] Gwyneth Sprout, FNP              Passed - Cr in normal range and within 360 days    Creatinine, Ser  Date Value Ref Range Status  11/17/2021 0.75 0.44 - 1.00 mg/dL Final         Passed - eGFR is 30 or above and within 360 days    GFR calc Af Amer  Date Value Ref Range Status  10/23/2019 115 >59 mL/min/1.73 Final   GFR, Estimated  Date Value Ref Range Status  11/17/2021 >60 >60 mL/min Final    Comment:    (NOTE) Calculated using the CKD-EPI Creatinine Equation (2021)    eGFR  Date Value Ref Range Status  07/04/2021 98 >59 mL/min/1.73 Final         Passed - Completed PHQ-2 or PHQ-9 in the last 360 days      Passed - Last BP in normal range    BP Readings from Last 1 Encounters:  01/17/22 119/73         Refused Prescriptions Disp Refills   DULoxetine (CYMBALTA) 30 MG capsule [Pharmacy Med Name: DULoxetine HCl 30 MG Oral Capsule Delayed Release Particles] 180 capsule 0    Sig: TAKE 2 CAPSULES BY MOUTH ONCE DAILY (PLEASE  SCHEDULE  AN  OFFICE  VISIT  BEFORE  ANYMORE  REFILLS)     Psychiatry: Antidepressants - SNRI - duloxetine Failed -  09/27/2022  2:23 PM      Failed - Valid encounter within last 6 months    Recent Outpatient Visits           8 months ago Annual physical exam   [facility] Gwyneth Sprout, FNP   11 months ago Hospital discharge follow-up   [facility] Gwyneth Sprout, [place]   1 year ago Anxiety and depression   [facility] Gwyneth Sprout, FNP   1 year ago Anxiety and depression   [facility] Tally Joe T, [place]   1 year ago RUQ pain   [facility] Gwyneth Sprout, FNP              Passed - Cr in normal range and within 360 days    Creatinine, Ser  Date Value Ref Range Status  11/17/2021 0.75 0.44 - 1.00 mg/dL Final         Passed - eGFR is 30 or above and within 360 days    GFR calc Af Wyvonnia Lora  Date Value Ref Range Status  10/23/2019 115 >59 mL/min/1.73 Final   GFR, Estimated  Date Value Ref Range Status  11/17/2021 >60 >60 mL/min Final    Comment:    (NOTE) Calculated using the CKD-EPI Creatinine Equation (2021)    eGFR  Date Value Ref Range Status  07/04/2021 98 >59 mL/min/1.73 Final         Passed - Completed PHQ-2 or PHQ-9 in the last 360 days      Passed - Last BP in normal range    BP Readings from Last 1 Encounters:  01/17/22 119/73

## 2022-09-27 NOTE — Telephone Encounter (Signed)
Pt stated her pharmacy advised her that they have not received the Rx refill request. The refill requests shows as print. Pt asked that Rx refill request be sent to  [facility], [place] Phone: [phone]  Fax: [phone]

## 2022-09-29 ENCOUNTER — Other Ambulatory Visit: Payer: Self-pay | Admitting: Family Medicine

## 2022-09-29 DIAGNOSIS — F419 Anxiety disorder, unspecified: Secondary | ICD-10-CM

## 2022-09-29 NOTE — Telephone Encounter (Signed)
  Unable to refill per protocol, last refill by  provider 09/25/22 for 180. Will refuse duplicate request.  Requested Prescriptions  Pending Prescriptions Disp Refills   DULoxetine (CYMBALTA) 30 MG capsule 180 capsule 0    Sig: Take 2 capsules (60 mg total) by mouth daily.     Psychiatry: Antidepressants - SNRI - duloxetine Failed - 09/29/2022 11:19 AM      Failed - Valid encounter within last 6 months    Recent Outpatient Visits           8 months ago Annual physical exam   [facility] Gwyneth Sprout, FNP   11 months ago Hospital discharge follow-up   [facility] Tally Joe T, FNP   1 year ago Anxiety and depression   [facility] Gwyneth Sprout, FNP   1 year ago Anxiety and depression   [facility] Gwyneth Sprout, FNP   1 year ago RUQ pain   [facility] Gwyneth Sprout, FNP              Passed - Cr in normal range and within 360 days    Creatinine, Ser  Date Value Ref Range Status  11/17/2021 0.75 0.44 - 1.00 mg/dL Final         Passed - eGFR is 30 or above and within 360 days    GFR calc Af Amer  Date Value Ref Range Status  10/23/2019 115 >59 mL/min/1.73 Final   GFR, Estimated  Date Value Ref Range Status  11/17/2021 >60 >60 mL/min Final    Comment:    (NOTE) Calculated using the CKD-EPI Creatinine Equation (2021)    eGFR  Date Value Ref Range Status  07/04/2021 98 >59 mL/min/1.73 Final         Passed - Completed PHQ-2 or PHQ-9 in the last 360 days      Passed - Last BP in normal range    BP Readings from Last 1 Encounters:  01/17/22 119/73

## 2022-09-29 NOTE — Telephone Encounter (Signed)
Requested medication (s) are due for refill today - no  Requested medication (s) are on the active medication list -yes  Future visit scheduled -no  Last refill: 09/25/22  Notes to clinic: Rx marked as no print- no pharmacy marked- please review for new Rx  Requested Prescriptions  Pending Prescriptions Disp Refills   DULoxetine (CYMBALTA) 30 MG capsule [Pharmacy Med Name: DULoxetine HCl 30 MG Oral Capsule Delayed Release Particles] 180 capsule 0    Sig: TAKE 2 CAPSULES BY MOUTH ONCE DAILY (PLEASE  SCHEDULE  AN  OFFICE  VISIT  BEFORE  ANYMORE  REFILLS)     Psychiatry: Antidepressants - SNRI - duloxetine Failed - 09/29/2022  9:49 AM      Failed - Valid encounter within last 6 months    Recent Outpatient Visits           8 months ago Annual physical exam   [facility] Gwyneth Sprout, FNP   11 months ago Hospital discharge follow-up   [facility] Tally Joe T, FNP   1 year ago Anxiety and depression   [facility] Tally Joe T, FNP   1 year ago Anxiety and depression   [facility] Tally Joe T, FNP   1 year ago RUQ pain   [facility] Gwyneth Sprout, FNP              Passed - Cr in normal range and within 360 days    Creatinine, Ser  Date Value Ref Range Status  11/17/2021 0.75 0.44 - 1.00 mg/dL Final         Passed - eGFR is 30 or above and within 360 days    GFR calc Af Amer  Date Value Ref Range Status  10/23/2019 115 >59 mL/min/1.73 Final   GFR, Estimated  Date Value Ref Range Status  11/17/2021 >60 >60 mL/min Final    Comment:    (NOTE) Calculated using the CKD-EPI Creatinine Equation (2021)    eGFR  Date Value Ref Range Status  07/04/2021 98 >59 mL/min/1.73 Final         Passed - Completed PHQ-2 or PHQ-9 in the last 360 days      Passed - Last BP in normal range    BP Readings from Last 1 Encounters:  01/17/22 119/73            Requested Prescriptions  Pending  Prescriptions Disp Refills   DULoxetine (CYMBALTA) 30 MG capsule [Pharmacy Med Name: DULoxetine HCl 30 MG Oral Capsule Delayed Release Particles] 180 capsule 0    Sig: TAKE 2 CAPSULES BY MOUTH ONCE DAILY (PLEASE  SCHEDULE  AN  OFFICE  VISIT  BEFORE  ANYMORE  REFILLS)     Psychiatry: Antidepressants - SNRI - duloxetine Failed - 09/29/2022  9:49 AM      Failed - Valid encounter within last 6 months    Recent Outpatient Visits           8 months ago Annual physical exam   [facility] Gwyneth Sprout, FNP   11 months ago Hospital discharge follow-up   [facility] Gwyneth Sprout, FNP   1 year ago Anxiety and depression   [facility] Gwyneth Sprout, FNP   1 year ago Anxiety and depression   [facility] Gwyneth Sprout, FNP   1 year ago RUQ pain   [facility] Gwyneth Sprout, FNP  Passed - Cr in normal range and within 360 days    Creatinine, Ser  Date Value Ref Range Status  11/17/2021 0.75 0.44 - 1.00 mg/dL Final         Passed - eGFR is 30 or above and within 360 days    GFR calc Af Amer  Date Value Ref Range Status  10/23/2019 115 >59 mL/min/1.73 Final   GFR, Estimated  Date Value Ref Range Status  11/17/2021 >60 >60 mL/min Final    Comment:    (NOTE) Calculated using the CKD-EPI Creatinine Equation (2021)    eGFR  Date Value Ref Range Status  07/04/2021 98 >59 mL/min/1.73 Final         Passed - Completed PHQ-2 or PHQ-9 in the last 360 days      Passed - Last BP in normal range    BP Readings from Last 1 Encounters:  01/17/22 119/73

## 2022-09-29 NOTE — Telephone Encounter (Signed)
Medication Refill - Medication: DULoxetine (CYMBALTA) 30 MG capsule [[id number]]  Patient was told dindt have to make another appt  Has the patient contacted their pharmacy? yes (Agent: If no, request that the patient contact the pharmacy for the refill. If patient does not wish to contact the pharmacy document the reason why and proceed with request.) (Agent: If yes, when and what did the pharmacy advise?)contact pcp  Preferred Pharmacy (with phone number or street name):  [facility], [place] Phone: [phone]  Fax: [phone]     Has the patient been seen for an appointment in the last year OR does the patient have an upcoming appointment? yes  Agent: Please be advised that RX refills may take up to 3 business days. We ask that you follow-up with your pharmacy.

## 2022-10-09 ENCOUNTER — Other Ambulatory Visit: Payer: Self-pay | Admitting: Family Medicine

## 2022-10-09 DIAGNOSIS — F32A Depression, unspecified: Secondary | ICD-10-CM

## 2022-10-13 MED ORDER — DULOXETINE HCL 30 MG PO CPEP: 60.0000 mg | ORAL_CAPSULE | Freq: Every day | ORAL | 0 refills | Status: DC

## 2022-10-13 NOTE — Telephone Encounter (Signed)
Patient called to inquire about medication refill for Cymbalta. Refill was sent 09/25/12 but was not received at the pharmacy. Print class states "no print". Medication was refused due to pt needing OV, but last OV 02/03/22 patient was instructed to come back in a year for CPE. Patient needs refill, routing for review.

## 2022-10-13 NOTE — Telephone Encounter (Signed)
Pt is calling back and requesting an update on her medication. Pt stated the pharmacy still does not have her medication that was sent on 10/05/22.  Please see medication details says (no print).  Please advise.

## 2022-10-13 NOTE — Addendum Note (Signed)
Addended by: Durwin Nora on: 10/13/2022 10:31 AM   Modules accepted: Orders

## 2022-10-13 NOTE — Addendum Note (Signed)
Addended by: Shawna Orleans on: 10/13/2022 10:39 AM   Modules accepted: Orders

## 2022-11-17 ENCOUNTER — Encounter: Payer: Self-pay | Admitting: Family Medicine

## 2022-11-17 ENCOUNTER — Other Ambulatory Visit: Payer: Self-pay | Admitting: Family Medicine

## 2022-11-17 MED ORDER — VALACYCLOVIR HCL 1 G PO TABS: 1000.0000 mg | ORAL_TABLET | Freq: Two times a day (BID) | ORAL | 11 refills | Status: DC

## 2022-11-20 ENCOUNTER — Ambulatory Visit: Payer: BC Managed Care – PPO | Admitting: Family Medicine

## 2022-11-20 NOTE — Progress Notes (Deleted)
     I,Montia Haslip E Kyliegh Jester,acting as a scribe for Gwyneth Sprout, FNP.,have documented all relevant documentation on the behalf of Gwyneth Sprout, FNP,as directed by  Gwyneth Sprout, FNP while in the presence of Gwyneth Sprout, FNP.   Established patient visit   Patient: Margaret Grant   DOB: [birth year]   27 y.o. Female  MRN: [id number] Visit Date: 11/20/2022  Today's healthcare provider: Gwyneth Sprout, FNP   No chief complaint on file.  Subjective    HPI  Patient comes in for medication refills.  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  She feels her anxiety is {Desc; severity:60313} and {improved/worse/unchanged:3041574} since last visit.  Symptoms: {Yes/No:20286} chest pain {Yes/No:20286} difficulty concentrating  {Yes/No:20286} dizziness {Yes/No:20286} fatigue  {Yes/No:20286} feelings of losing control {Yes/No:20286} insomnia  {Yes/No:20286} irritable {Yes/No:20286} palpitations  {Yes/No:20286} panic attacks {Yes/No:20286} racing thoughts  {Yes/No:20286} shortness of breath {Yes/No:20286} sweating  {Yes/No:20286} tremors/shakes    GAD-7 Results     No data to display          PHQ-9 Scores    01/17/2022    2:33 PM 10/14/2021    9:01 AM 07/20/2021    1:13 PM  PHQ9 SCORE ONLY  PHQ-9 Total Score 2 0 4     Medications: Outpatient Medications Prior to Visit  Medication Sig   valACYclovir (VALTREX) 1000 MG tablet Take 1 tablet (1,000 mg total) by mouth 2 (two) times daily.   albuterol (PROVENTIL) (2.5 MG/3ML) 0.083% nebulizer solution Take 3 mLs (2.5 mg total) by nebulization every 6 (six) hours as needed for wheezing or shortness of breath.   DULoxetine (CYMBALTA) 30 MG capsule Take 2 capsules (60 mg total) by mouth daily.   traZODone (DESYREL) 50 MG tablet Take 0.5-1 tablets (25-50 mg total) by mouth at bedtime as needed for sleep.   No facility-administered medications prior to visit.     Review of Systems  {Labs  Heme  Chem  Endocrine  Serology  Results Review (optional):23779}   Objective    There were no vitals taken for this visit. {Show previous vital signs (optional):23777}  Physical Exam  ***  No results found for any visits on 11/20/22.  Assessment & Plan     ***  No follow-ups on file.      {provider attestation***:1}   Gwyneth Sprout, [place] [phone] (phone) [phone] (fax)  [place]

## 2023-01-29 ENCOUNTER — Encounter: Payer: Self-pay | Admitting: Family Medicine

## 2023-01-29 ENCOUNTER — Ambulatory Visit (INDEPENDENT_AMBULATORY_CARE_PROVIDER_SITE_OTHER): Payer: BC Managed Care – PPO | Admitting: Family Medicine

## 2023-01-29 VITALS — BP 133/76 | HR 88 | Temp 97.6°F | Resp 20 | Wt 258.0 lb

## 2023-01-29 DIAGNOSIS — F419 Anxiety disorder, unspecified: Secondary | ICD-10-CM

## 2023-01-29 DIAGNOSIS — J329 Chronic sinusitis, unspecified: Secondary | ICD-10-CM

## 2023-01-29 DIAGNOSIS — B009 Herpesviral infection, unspecified: Secondary | ICD-10-CM | POA: Diagnosis not present

## 2023-01-29 DIAGNOSIS — T7840XA Allergy, unspecified, initial encounter: Secondary | ICD-10-CM

## 2023-01-29 DIAGNOSIS — J45990 Exercise induced bronchospasm: Secondary | ICD-10-CM

## 2023-01-29 DIAGNOSIS — F32A Depression, unspecified: Secondary | ICD-10-CM

## 2023-01-29 MED ORDER — ALBUTEROL SULFATE HFA 108 (90 BASE) MCG/ACT IN AERS: 2.0000 | INHALATION_SPRAY | Freq: Four times a day (QID) | RESPIRATORY_TRACT | 2 refills | Status: AC | PRN

## 2023-01-29 MED ORDER — DULOXETINE HCL 60 MG PO CPEP: 60.0000 mg | ORAL_CAPSULE | Freq: Every day | ORAL | 3 refills | Status: DC

## 2023-01-29 MED ORDER — VALACYCLOVIR HCL 1 G PO TABS: 1000.0000 mg | ORAL_TABLET | Freq: Two times a day (BID) | ORAL | 11 refills | Status: AC

## 2023-01-29 NOTE — Assessment & Plan Note (Signed)
Acute on chronic, is taking OTC allergy tablet as well as intranasal steroids Continue preventative treatment at this time; declines referral to allergist

## 2023-01-29 NOTE — Patient Instructions (Signed)
Please attach work physical and labs as an attachment to a my chart message.  Take care, Gwyneth Sprout, [place] [address] [place], [place] [address] [phone] (phone) [phone] (fax) [place]

## 2023-01-29 NOTE — Assessment & Plan Note (Signed)
Reports frequent sinus infections since 12/23; daughter is in daycare and reports frequent illnesses since she started No complaints today

## 2023-01-29 NOTE — Progress Notes (Signed)
I,Sulibeya S Dimas,acting as a Education administrator for Gwyneth Sprout, FNP.,have documented all relevant documentation on the behalf of Gwyneth Sprout, FNP,as directed by  Gwyneth Sprout, FNP while in the presence of Gwyneth Sprout, FNP.   Established patient visit  Patient: Margaret Grant   DOB: 1996/01/23   27 y.o. Female  MRN: [id number] Visit Date: 01/29/2023  Today's healthcare provider: Gwyneth Sprout, FNP  Introduced to nurse practitioner role and practice setting.  All questions answered.  Discussed provider/patient relationship and expectations.  Chief Complaint  Patient presents with   Depression   Anxiety   Subjective    HPI  Anxiety/depression, Follow-up  She was last seen for anxiety 1 years ago. Changes made at last visit include no changes.   She reports excellent compliance with treatment. She reports excellent tolerance of treatment. She is not having side effects.   She feels her anxiety is mild and Improved since last visit.   GAD-7 Results    01/29/2023   10:30 AM  GAD-7 Generalized Anxiety Disorder Screening Tool  1. Feeling Nervous, Anxious, or on Edge 0  2. Not Being Able to Stop or Control Worrying 0  3. Worrying Too Much About Different Things 0  4. Trouble Relaxing 0  5. Being So Restless it's Hard To Sit Still 0  6. Becoming Easily Annoyed or Irritable 0  7. Feeling Afraid As If Something Awful Might Happen 0  Total GAD-7 Score 0  Difficulty At Work, Home, or Getting  Along With Others? Not difficult at all    PHQ-9 Scores    01/29/2023   10:30 AM 01/17/2022    2:33 PM 10/14/2021    9:01 AM  PHQ9 SCORE ONLY  PHQ-9 Total Score 0 2 0   ---------------------------------------------------------------------------------------------------  Medications: Outpatient Medications Prior to Visit  Medication Sig   [DISCONTINUED] albuterol (PROVENTIL) (2.5 MG/3ML) 0.083% nebulizer solution Take 3 mLs (2.5 mg total) by nebulization every 6 (six) hours as  needed for wheezing or shortness of breath.   [DISCONTINUED] DULoxetine (CYMBALTA) 30 MG capsule Take 2 capsules (60 mg total) by mouth daily.   [DISCONTINUED] traZODone (DESYREL) 50 MG tablet Take 0.5-1 tablets (25-50 mg total) by mouth at bedtime as needed for sleep.   [DISCONTINUED] valACYclovir (VALTREX) 1000 MG tablet Take 1 tablet (1,000 mg total) by mouth 2 (two) times daily.   No facility-administered medications prior to visit.    Review of Systems  Constitutional:  Negative for chills and fever.  Respiratory:  Negative for cough, chest tightness and shortness of breath.   Cardiovascular:  Negative for chest pain and leg swelling.  Gastrointestinal:  Negative for abdominal pain, nausea and vomiting.  Psychiatric/Behavioral:  Negative for agitation, decreased concentration and sleep disturbance. The patient is not nervous/anxious.       Objective    BP 133/76 (BP Location: Right Arm, Patient Position: Sitting, Cuff Size: Large)   Pulse 88   Temp 97.6 F (36.4 C) (Temporal)   Resp 20   Wt 258 lb (117 kg)   LMP 01/08/2023 (Approximate)   SpO2 98%   BMI 41.64 kg/m   Physical Exam Vitals and nursing note reviewed.  Constitutional:      General: She is not in acute distress.    Appearance: Normal appearance. She is obese. She is not ill-appearing, toxic-appearing or diaphoretic.  HENT:     Head: Normocephalic and atraumatic.  Cardiovascular:     Rate and Rhythm: Normal rate and regular  rhythm.     Pulses: Normal pulses.     Heart sounds: Normal heart sounds. No murmur heard.    No friction rub. No gallop.  Pulmonary:     Effort: Pulmonary effort is normal. No respiratory distress.     Breath sounds: Normal breath sounds. No stridor. No wheezing, rhonchi or rales.  Chest:     Chest wall: No tenderness.  Musculoskeletal:        General: No swelling, tenderness, deformity or signs of injury. Normal range of motion.     Right lower leg: No edema.     Left lower leg: No  edema.  Skin:    General: Skin is warm and dry.     Capillary Refill: Capillary refill takes less than 2 seconds.     Coloration: Skin is not jaundiced or pale.     Findings: No bruising, erythema, lesion or rash.  Neurological:     General: No focal deficit present.     Mental Status: She is alert and oriented to person, place, and time. Mental status is at baseline.     Cranial Nerves: No cranial nerve deficit.     Sensory: No sensory deficit.     Motor: No weakness.     Coordination: Coordination normal.  Psychiatric:        Mood and Affect: Mood normal.        Behavior: Behavior normal.        Thought Content: Thought content normal.        Judgment: Judgment normal.     No results found for any visits on 01/29/23.  Assessment & Plan     Problem List Items Addressed This Visit       Respiratory   Exercise-induced asthma    Chronic, stable Request to change medication on file to inhaler as pt does not have nebulizer       Relevant Medications   albuterol (VENTOLIN HFA) 108 (90 Base) MCG/ACT inhaler   Recurrent sinus infections    Reports frequent sinus infections since 12/23; daughter is in daycare and reports frequent illnesses since she started No complaints today      Relevant Medications   valACYclovir (VALTREX) 1000 MG tablet     Other   Allergies    Acute on chronic, is taking OTC allergy tablet as well as intranasal steroids Continue preventative treatment at this time; declines referral to allergist       Anxiety and depression - Primary    Chronic, stable Wishes to continue cymbalta at 60 mg No complaints or side effects      Relevant Medications   DULoxetine (CYMBALTA) 60 MG capsule   HSV-1 infection    Chronic, 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 Continue to recommend balanced, lower carb meals. Smaller meal size, adding snacks. Choosing water as drink of choice and increasing purposeful exercise.       Return in about 1 year (around 01/29/2024) for chonic disease management.     Vonna Kotyk, FNP, have reviewed all documentation for this visit. The documentation on 01/29/23 for the exam, diagnosis, procedures, and orders are all accurate and complete.  Gwyneth Sprout, [place] [phone] (phone) [phone] (fax)  [place]

## 2023-01-29 NOTE — Assessment & Plan Note (Signed)
Chronic, stable Request to change medication on file to inhaler as pt does not have nebulizer

## 2023-01-29 NOTE — Assessment & Plan Note (Signed)
Chronic, stable Wishes to continue cymbalta at 60 mg No complaints or side effects

## 2023-01-29 NOTE — Assessment & Plan Note (Signed)
Chronic, stable Body mass index is 41.64 kg/m. Continue to recommend balanced, lower carb meals. Smaller meal size, adding snacks. Choosing water as drink of choice and increasing purposeful exercise.

## 2023-01-29 NOTE — Assessment & Plan Note (Signed)
Chronic, stable Reports recent flares x4 in a row; request for additional abortive treatments No current s/s

## 2023-09-27 ENCOUNTER — Encounter: Payer: Self-pay | Admitting: Family Medicine

## 2023-09-27 ENCOUNTER — Ambulatory Visit: Payer: BC Managed Care – PPO | Admitting: Family Medicine

## 2023-09-27 VITALS — BP 115/77 | HR 76 | Ht 66.0 in | Wt 255.0 lb

## 2023-09-27 DIAGNOSIS — K668 Other specified disorders of peritoneum: Secondary | ICD-10-CM | POA: Insufficient documentation

## 2023-09-27 DIAGNOSIS — F419 Anxiety disorder, unspecified: Secondary | ICD-10-CM | POA: Diagnosis not present

## 2023-09-27 DIAGNOSIS — F32A Depression, unspecified: Secondary | ICD-10-CM | POA: Diagnosis not present

## 2023-09-27 NOTE — Assessment & Plan Note (Signed)
Acute on chronic, increase in size and change in pain Recommend [place]

## 2023-09-27 NOTE — Assessment & Plan Note (Signed)
Chronic, stable Continue cymbalta at previous dosing 60 mg every day    09/27/2023   10:00 AM 01/29/2023   10:30 AM 01/17/2022    2:33 PM  PHQ9 SCORE ONLY  PHQ-9 Total Score 3 0 2      09/27/2023   10:00 AM 01/29/2023   10:30 AM  GAD 7 : Generalized Anxiety Score  Nervous, Anxious, on Edge 1 0  Control/stop worrying 0 0  Worry too much - different things 1 0  Trouble relaxing 1 0  Restless 0 0  Easily annoyed or irritable 1 0  Afraid - awful might happen 0 0  Total GAD 7 Score 4 0  Anxiety Difficulty Not difficult at all Not difficult at all

## 2023-09-27 NOTE — Progress Notes (Signed)
Established patient visit  Patient: Margaret Grant   DOB: 08-04-1996   27 y.o. Female  MRN: [id number] Visit Date: 09/27/2023  Today's healthcare provider: Jacky Kindle, FNP  Re Introduced to nurse practitioner role and practice setting.  All questions answered.  Discussed provider/patient relationship and expectations.  Chief Complaint  Patient presents with   Mass   Subjective    HPI HPI   Pt stated--RUQ lump/mass--dull ache which is increase size-2.5 years Last edited by Shelly Bombard, CMA on 09/27/2023  9:52 AM.      The patient, a mother of a young child, presents with a right upper quadrant lump that has been present since her pregnancy. The lump, initially pea-sized, has grown and become more painful. The patient denies any relation to her gallbladder or liver, as imaging was done previously. The discomfort is described as a dull ache, not constant but noticeable when the patient is full after eating or when she thinks about it. The patient denies any interventions to alleviate the pain as it is not severe. The lump does not impede her work or ability to care for her child, but it causes her concern due to its unknown nature.  Medications: Outpatient Medications Prior to Visit  Medication Sig   albuterol (VENTOLIN HFA) 108 (90 Base) MCG/ACT inhaler Inhale 2 puffs into the lungs every 6 (six) hours as needed for wheezing or shortness of breath.   DULoxetine (CYMBALTA) 60 MG capsule Take 1 capsule (60 mg total) by mouth daily.   No facility-administered medications prior to visit.     Objective    BP 115/77 (BP Location: Right Arm, Patient Position: Sitting, Cuff Size: Large)   Pulse 76   Ht 5\' 6"  (1.676 m)   Wt 255 lb (115.7 kg)   SpO2 98%   BMI 41.16 kg/m   Physical Exam Vitals and nursing note reviewed.  Constitutional:      General: She is not in acute distress.    Appearance: Normal appearance. She is obese. She is not ill-appearing, toxic-appearing or  diaphoretic.  HENT:     Head: Normocephalic and atraumatic.  Cardiovascular:     Rate and Rhythm: Normal rate and regular rhythm.     Pulses: Normal pulses.     Heart sounds: Normal heart sounds. No murmur heard.    No friction rub. No gallop.  Pulmonary:     Effort: Pulmonary effort is normal. No respiratory distress.     Breath sounds: Normal breath sounds. No stridor. No wheezing, rhonchi or rales.  Chest:     Chest wall: No tenderness.  Abdominal:     General: Bowel sounds are normal.     Palpations: Abdomen is soft. There is mass.     Tenderness: There is abdominal tenderness.     Comments: 2 cm round cyst like mass at 845 along R flank  Musculoskeletal:        General: No swelling, tenderness, deformity or signs of injury. Normal range of motion.     Right lower leg: No edema.     Left lower leg: No edema.  Skin:    General: Skin is warm and dry.     Capillary Refill: Capillary refill takes less than 2 seconds.     Coloration: Skin is not jaundiced or pale.     Findings: No bruising, erythema, lesion or rash.  Neurological:     General: No focal deficit present.     Mental Status: She  is alert and oriented to person, place, and time. Mental status is at baseline.     Cranial Nerves: No cranial nerve deficit.     Sensory: No sensory deficit.     Motor: No weakness.     Coordination: Coordination normal.  Psychiatric:        Mood and Affect: Mood normal.        Behavior: Behavior normal.        Thought Content: Thought content normal.        Judgment: Judgment normal.     No results found for any visits on 09/27/23.  Assessment & Plan     Problem List Items Addressed This Visit       Other   Anxiety and depression    Chronic, stable Continue cymbalta at previous dosing 60 mg every day    09/27/2023   10:00 AM 01/29/2023   10:30 AM 01/17/2022    2:33 PM  PHQ9 SCORE ONLY  PHQ-9 Total Score 3 0 2      09/27/2023   10:00 AM 01/29/2023   10:30 AM  GAD 7 :  Generalized Anxiety Score  Nervous, Anxious, on Edge 1 0  Control/stop worrying 0 0  Worry too much - different things 1 0  Trouble relaxing 1 0  Restless 0 0  Easily annoyed or irritable 1 0  Afraid - awful might happen 0 0  Total GAD 7 Score 4 0  Anxiety Difficulty Not difficult at all Not difficult at all          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 Discussed importance of healthy weight management Discussed diet and exercise Continue to recommend balanced, lower carb meals. Smaller meal size, adding snacks. Choosing water as drink of choice and increasing purposeful exercise.       Right Upper Quadrant Mass Palpable mass in the right upper quadrant, present since pregnancy and has grown in size. No associated pain, but occasional discomfort, especially after eating or when abdomen is full. No impact on daily activities. Differential diagnosis includes cyst, hernia, or other soft tissue mass. -Order targeted ultrasound of the right upper quadrant to further evaluate the mass.  Leilani Merl, FNP, have reviewed all documentation for this visit. The documentation on 09/27/23 for the exam, diagnosis, procedures, and orders are all accurate and complete.  Jacky Kindle, FNP  [facility] [phone] (phone) [phone] (fax)  [facility]

## 2023-09-27 NOTE — Assessment & Plan Note (Signed)
Chronic, Body mass index is 41.16 kg/m. Discussed importance of healthy weight management Discussed diet and exercise Continue to recommend balanced, lower carb meals. Smaller meal size, adding snacks. Choosing water as drink of choice and increasing purposeful exercise.

## 2023-10-04 ENCOUNTER — Encounter: Payer: Self-pay | Admitting: Family Medicine

## 2023-10-04 ENCOUNTER — Ambulatory Visit
Admission: RE | Admit: 2023-10-04 | Discharge: 2023-10-04 | Disposition: A | Discharge status: Home / Self Care | Payer: BC Managed Care – PPO | Source: Ambulatory Visit | Attending: Family Medicine | Admitting: Family Medicine

## 2023-10-04 DIAGNOSIS — K668 Other specified disorders of peritoneum: Secondary | ICD-10-CM | POA: Insufficient documentation

## 2023-10-05 ENCOUNTER — Other Ambulatory Visit: Payer: Self-pay | Admitting: Family Medicine

## 2023-10-05 DIAGNOSIS — K668 Other specified disorders of peritoneum: Secondary | ICD-10-CM

## 2023-10-12 ENCOUNTER — Other Ambulatory Visit: Payer: BC Managed Care – PPO

## 2023-10-15 ENCOUNTER — Encounter: Payer: Self-pay | Admitting: Surgery

## 2023-10-15 ENCOUNTER — Ambulatory Visit (INDEPENDENT_AMBULATORY_CARE_PROVIDER_SITE_OTHER): Payer: BC Managed Care – PPO | Admitting: Surgery

## 2023-10-15 VITALS — BP 112/72 | HR 87 | Temp 98.1°F | Ht 66.0 in | Wt 261.0 lb

## 2023-10-15 DIAGNOSIS — D171 Benign lipomatous neoplasm of skin and subcutaneous tissue of trunk: Secondary | ICD-10-CM

## 2023-10-15 NOTE — Patient Instructions (Signed)
Lipoma  A lipoma is a noncancerous (benign) tumor that is made up of fat cells. This is a very common type of soft-tissue growth. Lipomas are usually found under the skin (subcutaneous). They may occur in any tissue of the body that contains fat. Common areas for lipomas to appear include the back, arms, shoulders, buttocks, and thighs. Lipomas grow slowly, and they are usually painless. Most lipomas do not cause problems and do not require treatment. What are the causes? The cause of this condition is not known. What increases the risk? You are more likely to develop this condition if: You are 40-60 years old. You have a family history of lipomas. What are the signs or symptoms? A lipoma usually appears as a small, round bump under the skin. In most cases, the lump will: Feel soft or rubbery. Not cause pain or other symptoms. However, if a lipoma is located in an area where it pushes on nerves, it can become painful or cause other symptoms. How is this diagnosed? A lipoma can usually be diagnosed with a physical exam. You may also have tests to confirm the diagnosis and to rule out other conditions. Tests may include: Imaging tests, such as a CT scan or an MRI. Removal of a tissue sample to be looked at under a microscope (biopsy). How is this treated? Treatment for this condition depends on the size of the lipoma and whether it is causing any symptoms. For small lipomas that are not causing problems, no treatment is needed. If a lipoma is bigger or it causes problems, surgery may be done to remove the lipoma. Lipomas can also be removed to improve appearance. Most often, the procedure is done after applying a medicine that numbs the area (local anesthetic). Liposuction may be done to reduce the size of the lipoma before it is removed through surgery, or it may be done to remove the lipoma. Lipomas are removed with this method to limit incision size and scarring. A liposuction tube is  inserted through a small incision into the lipoma, and the contents of the lipoma are removed through the tube with suction. Follow these instructions at home: Watch your lipoma for any changes. Keep all follow-up visits. This is important. Where to find more information OrthoInfo: orthoinfo.aaos.org Contact a health care provider if: Your lipoma becomes larger or hard. Your lipoma becomes painful, red, or increasingly swollen. These could be signs of infection or a more serious condition. Get help right away if: You develop tingling or numbness in an area near the lipoma. This could indicate that the lipoma is causing nerve damage. Summary A lipoma is a noncancerous tumor that is made up of fat cells. Most lipomas do not cause problems and do not require treatment. If a lipoma is bigger or it causes problems, surgery may be done to remove the lipoma. Contact a health care provider if your lipoma becomes larger or hard, or if it becomes painful, red, or increasingly swollen. These could be signs of infection or a more serious condition. This information is not intended to replace advice given to you by your health care provider. Make sure you discuss any questions you have with your health care provider. Document Revised: 11/18/2021 Document Reviewed: 11/18/2021 Elsevier Patient Education  2024 [employer] 

## 2023-10-15 NOTE — Progress Notes (Signed)
10/15/2023  History of Present Illness: Margaret Grant is a 27 y.o. female presenting for evaluation of a right upper quadrant mass.  The patient is s/p robotic assisted cholecystectomy on 11/23/21.  The patient reports that before her surgery she had felt a small mass that was about pea size in her right upper quadrant lateral abdominal wall.  She had not thought much about it but recently she felt the mass again and feels that it has gotten bigger.  She denies any specific pain issues with it, but sometimes it's sore when pushing on it.  Given the increase in size, she presented to her PCP.  She had an ultrasound of the mass on 10/04/23, which showed a 2.6 x 1 x 2.7 cm mass consistent with a lipoma.    Past Medical History: Past Medical History:  Diagnosis Date   Allergy    Anxiety    Asthma    Labial cyst    Panic attacks    Pilonidal cyst      Past Surgical History: Past Surgical History:  Procedure Laterality Date   KNEE ARTHROSCOPY W/ MENISCAL REPAIR Right 2012   WISDOM TOOTH EXTRACTION  2017    Home Medications: Prior to Admission medications   Medication Sig Start Date End Date Taking? Authorizing Provider  albuterol (VENTOLIN HFA) 108 (90 Base) MCG/ACT inhaler Inhale 2 puffs into the lungs every 6 (six) hours as needed for wheezing or shortness of breath. 01/29/23  Yes Jacky Kindle, FNP  DULoxetine (CYMBALTA) 60 MG capsule Take 1 capsule (60 mg total) by mouth daily. 01/29/23  Yes Jacky Kindle, FNP    Allergies: No Known Allergies  Review of Systems: Review of Systems  Constitutional:  Negative for chills and fever.  Respiratory:  Negative for shortness of breath.   Cardiovascular:  Negative for chest pain.  Gastrointestinal:  Negative for abdominal pain, nausea and vomiting.  Skin:        Mass in RUQ abdominal wall.    Physical Exam BP 112/72   Pulse 87   Temp 98.1 F (36.7 C) (Oral)   Ht 5\' 6"  (1.676 m)   Wt 261 lb (118.4 kg)   SpO2 96%   BMI  42.13 kg/m  CONSTITUTIONAL: No acute distress HEENT:  Normocephalic, atraumatic, extraocular motion intact. RESPIRATORY:  Normal respiratory effort without pathologic use of accessory muscles. CARDIOVASCULAR: Regular rhythm and rate. GI: The abdomen is soft, non-distended, non-tender to palpation.  The patient has an approximately 2.5 cm mass in the lateral aspect of the right upper quadrant abdominal wall.  It is somewhat mobile, soft, without significant tenderness to palpation.  No overlying skin changes, induration, or erythema.  NEUROLOGIC:  Motor and sensation is grossly normal.  Cranial nerves are grossly intact. PSYCH:  Alert and oriented to person, place and time. Affect is normal.  Labs/Imaging: U/S abdominal wall 10/04/23: FINDINGS:  There is a subtle isoechoic 2.6 x 1.0 x 2.7 cm oval mass at the site of concern. No fluid collection identified.   IMPRESSION: There is a subtle isoechoic oval mass at the site of concern. This may represent a lipoma. Recommend correlation with physical exam. If there is evidence of growth, recommend biopsy, excision or short-term follow-up.  Assessment and Plan: This is a 27 y.o. female with a right upper quadrant abdominal wall lipoma.  --Discussed with the patient the findings on her exam and imaging.  The mass on exam is consistent with a lipoma.  Discussed with the patient  that if the mass has grown since before her surgery, this is a slow growing mass.  Given the characteristics on ultrasound, this appears to be a benign mass such as a lipoma.   --Discussed with the patient that lipomas as benign masses and excision mostly depends on symptoms, growth, or patient preference.  Discussed with her that this mass could be excised as an office procedure awake with local anesthesia, or in the OR under general anesthesia.  Currently she's not having much symptoms from it and after further discussion, she would like to wait for now and defer any  surgery/procedure.   --Advised that she contact us if she feels the mass is growing more, or if she starts having symptoms from it, or if she changes her mind about excision sooner.   --Follow up as needed.  I spent 30 minutes dedicated to the care of this patient on the date of this encounter to include pre-visit review of records, face-to-face time with the patient discussing diagnosis and management, and any post-visit coordination of care.   Howie Ill, MD [facility]

## 2024-03-25 ENCOUNTER — Telehealth (INDEPENDENT_AMBULATORY_CARE_PROVIDER_SITE_OTHER): Payer: Self-pay | Admitting: Physician Assistant

## 2024-03-25 ENCOUNTER — Ambulatory Visit: Payer: Self-pay | Admitting: Physician Assistant

## 2024-03-25 DIAGNOSIS — Z91199 Patient's noncompliance with other medical treatment and regimen due to unspecified reason: Secondary | ICD-10-CM

## 2024-03-27 ENCOUNTER — Encounter: Payer: Self-pay | Admitting: Family Medicine

## 2024-03-27 ENCOUNTER — Telehealth: Admitting: Family Medicine

## 2024-03-27 DIAGNOSIS — O99341 Other mental disorders complicating pregnancy, first trimester: Secondary | ICD-10-CM | POA: Diagnosis not present

## 2024-03-27 DIAGNOSIS — Z3A01 Less than 8 weeks gestation of pregnancy: Secondary | ICD-10-CM | POA: Diagnosis not present

## 2024-03-27 DIAGNOSIS — F419 Anxiety disorder, unspecified: Secondary | ICD-10-CM

## 2024-03-27 DIAGNOSIS — F32A Depression, unspecified: Secondary | ICD-10-CM | POA: Diagnosis not present

## 2024-03-27 MED ORDER — DULOXETINE HCL 60 MG PO CPEP: 60.0000 mg | ORAL_CAPSULE | Freq: Every day | ORAL | 0 refills | Status: AC

## 2024-03-27 NOTE — Progress Notes (Signed)
 MyChart Video Visit    Virtual Visit via Video Note   This format is felt to be most appropriate for this patient at this time. Physical exam was limited by quality of the video and audio technology used for the visit.   Patient location: Patient's home address   Provider location: [facility]  [address], [address]  [place], [place] [address]   I discussed the limitations of evaluation and management by telemedicine and the availability of in person appointments. The patient expressed understanding and agreed to proceed.  Patient: Margaret Grant   DOB: May 12, 1996   28 y.o. Female  MRN: [id number] Visit Date: 03/27/2024  Today's healthcare provider: Mimi Alt, MD   No chief complaint on file.  Subjective    HPI   Discussed the use of AI scribe software for clinical note transcription with the patient, who gave verbal consent to proceed.  History of Present Illness Margaret Grant is a 28 year old female who presents with concerns about medication use during pregnancy.  She is currently taking duloxetine  60 mg daily for anxiety, which she has been on for about three years. The medication has effectively controlled her anxiety. She uses albuterol  as needed.  She recently discovered she is pregnant and is approximately five weeks and two days along, based on her calculations from the first day of her last menstrual period. Her first obstetric appointment is scheduled for Apr 03, 2024.  She has not experienced any significant adverse effects from duloxetine , although she notes increased anxiety when she has missed doses for a few days in the past.  She has not yet started taking prenatal vitamins and continues to take a women's one-a-day multivitamin.      Past Medical History:  Diagnosis Date   Allergy    Anxiety    Asthma    Labial cyst    Panic attacks    Pilonidal cyst     Medications: Outpatient Medications  Prior to Visit  Medication Sig   albuterol  (VENTOLIN  HFA) 108 (90 Base) MCG/ACT inhaler Inhale 2 puffs into the lungs every 6 (six) hours as needed for wheezing or shortness of breath.   [DISCONTINUED] DULoxetine  (CYMBALTA ) 60 MG capsule Take 1 capsule (60 mg total) by mouth daily.   No facility-administered medications prior to visit.    Review of Systems      Objective    There were no vitals taken for this visit.      Physical Exam Constitutional:      Appearance: Normal appearance.  Pulmonary:     Effort: Pulmonary effort is normal. No respiratory distress.  Neurological:     Mental Status: She is alert.  Psychiatric:        Mood and Affect: Mood normal.        Behavior: Behavior normal.        Assessment & Plan     Problem List Items Addressed This Visit       Other   Anxiety and depression - Primary   Relevant Medications   DULoxetine  (CYMBALTA ) 60 MG capsule   Other Visit Diagnoses       Less than [redacted] weeks gestation of pregnancy            Assessment & Plan Pregnancy Approximately five weeks pregnant based on last menstrual period. First OB appointment scheduled for Apr 03, 2024. Prenatal care and medication safety during pregnancy discussed. Albuterol  use remains safe during pregnancy. - Attend  OB appointment on Apr 03, 2024. - Start prenatal vitamins. - Continue albuterol  as needed.  Anxiety disorder Anxiety disorder well-controlled with duloxetine  for the past three years. Pregnancy raises concerns about duloxetine  safety, associated with postpartum bleeding and potential preterm birth if started during pregnancy. Risk for those already on duloxetine  is unclear, with a level three risk score. Alternative medications like citalopram or escitalopram have similar risk profiles, requiring a cross taper if switched. Decision to continue duloxetine  until OB appointment, considering absence of high risk for malformations early in pregnancy and  importance of anxiety control. - Continue duloxetine  60 mg until OB appointment. - Discuss anxiety management with obstetrician at upcoming appointment. - Consider cross taper to citalopram or escitalopram if recommended by OB.     No follow-ups on file.     I discussed the assessment and treatment plan with the patient. The patient was provided an opportunity to ask questions and all were answered. The patient agreed with the plan and demonstrated an understanding of the instructions.   The patient was advised to call back or seek an in-person evaluation if the symptoms worsen or if the condition fails to improve as anticipated.  I provided 20 minutes of non-face-to-face time during this encounter.   Mimi Alt, MD [facility] [phone] (phone) [phone] (fax)  [facility]

## 2024-03-30 NOTE — Progress Notes (Signed)
 Cannot connect. Did not respond on calls

## 2024-05-12 LAB — PANORAMA PRENATAL TEST FULL PANEL:PANORAMA TEST PLUS 5 ADDITIONAL MICRODELETIONS: FETAL FRACTION: 2.6

## 2024-05-30 LAB — PANORAMA PRENATAL TEST FULL PANEL:PANORAMA TEST PLUS 5 ADDITIONAL MICRODELETIONS: FETAL FRACTION: 5.5

## 2024-09-05 ENCOUNTER — Encounter: Payer: Self-pay | Admitting: Physician Assistant

## 2024-09-16 NOTE — Progress Notes (Signed)
   Provider: Sharene JONETTA Ruth, CNM Division: General Obstetrics, Gynecology and Midwifery  Return Prenatal Note   Assessment/Plan   Plan 28 y.o. G2P1001 at [redacted]w[redacted]d presents for follow-up OB visit. Reviewed prenatal record including previous visit note.  Encounter for supervision of normal pregnancy, antepartum (HHS-HCC) Doing well, +FM, -LOF, - vaginal bleeding, -cramping/ctxs, questions answered, routine precautions reviewed  Patient reports feeling lightheaded at times and foggyness Urine dip: negative ketones, or other abnormalities Discussed hydration, rest, and close monitoring of any cardiac symptoms    Obesity affecting pregnancy, antepartum (HHS-HCC) 32 wk growth ordered Discussed will need final growth at 36 weeks Weekly NSTs starting at 34 weeks     Immunization counseling Offered flu vaccine-declines Offered Tdap-given today  Orders Placed This Encounter  Procedures  . Tdap vaccine greater than or equal to 7yo IM  . US  OB Follow Up, [place]    Standing Status:   Future    Expected Date:   09/30/2024    Expiration Date:   09/16/2025    Reason for Exam::   Growth at 32 weeks for BMI>40    Is the patient pregnant?:   Yes    Performed at:   [facility]   Return in about 2 weeks (around 09/30/2024) for Return OB visit with Midwives.   Future Appointments  Date Time Provider Department Center  09/30/2024  3:20 PM Ruth Sharene JONETTA, CNM [facility]  10/14/2024  3:20 PM Ruth Sharene JONETTA, CNM [facility]   For next visit: Continue with routine prenatal care     Subjective   28 y.o. G2P1001 at [redacted]w[redacted]d presents for this follow-up prenatal visit.   Patient is concerned about feeling light headed at times.  Patient reports: Movement: Present Loss of Fluid: No Bleeding: No Contractions: Not present  Objective   Flow sheet Vitals: Pulse: 91 BP: 104/80 Fundal Height (cm): 30 cm Fetal Heart Rate: 145 Total weight gain: 13.2 kg (29 lb 1.6  oz)  General Appearance  No acute distress, well appearing, and well nourished Pulmonary   Normal work of breathing Neurologic   Alert and oriented to person, place, and time Psychiatric   Mood and affect within normal limits
